# Patient Record
Sex: Male | Born: 1975 | Race: White | Hispanic: No | Marital: Single | Smoking: Former smoker
Health system: Southern US, Community
[De-identification: ages and names within clinical notes are randomized; demographics above are authoritative.]

## PROBLEM LIST (undated history)

## (undated) DIAGNOSIS — Z22322 Carrier or suspected carrier of Methicillin resistant Staphylococcus aureus: Secondary | ICD-10-CM

## (undated) DIAGNOSIS — J45909 Unspecified asthma, uncomplicated: Secondary | ICD-10-CM

## (undated) HISTORY — PX: CHOLECYSTECTOMY: SHX55

---

## 2009-05-06 ENCOUNTER — Emergency Department: Payer: Self-pay | Admitting: Unknown Physician Specialty

## 2009-08-23 ENCOUNTER — Emergency Department (HOSPITAL_COMMUNITY): Admission: EM | Admit: 2009-08-23 | Discharge: 2009-08-23 | Payer: Self-pay | Admitting: Emergency Medicine

## 2010-01-30 ENCOUNTER — Emergency Department (HOSPITAL_COMMUNITY)
Admission: EM | Admit: 2010-01-30 | Discharge: 2010-01-30 | Payer: Self-pay | Source: Home / Self Care | Admitting: Emergency Medicine

## 2010-03-02 ENCOUNTER — Emergency Department: Payer: Self-pay | Admitting: Internal Medicine

## 2011-02-13 ENCOUNTER — Emergency Department: Payer: Self-pay | Admitting: Emergency Medicine

## 2011-02-13 LAB — URINALYSIS, COMPLETE
Bilirubin,UR: NEGATIVE
Blood: NEGATIVE
Ketone: NEGATIVE
Ph: 6 (ref 4.5–8.0)
Protein: NEGATIVE
RBC,UR: NONE SEEN /HPF (ref 0–5)
Specific Gravity: 1.024 (ref 1.003–1.030)
Squamous Epithelial: NONE SEEN

## 2011-02-13 LAB — LIPASE, BLOOD: Lipase: 91 U/L (ref 73–393)

## 2011-02-13 LAB — CBC
HGB: 14.5 g/dL (ref 13.0–18.0)
MCH: 31.1 pg (ref 26.0–34.0)
MCV: 90 fL (ref 80–100)
RBC: 4.67 10*6/uL (ref 4.40–5.90)
RDW: 12.8 % (ref 11.5–14.5)

## 2011-02-13 LAB — COMPREHENSIVE METABOLIC PANEL
Anion Gap: 8 (ref 7–16)
Calcium, Total: 8.7 mg/dL (ref 8.5–10.1)
Chloride: 104 mmol/L (ref 98–107)
EGFR (African American): >60
EGFR (Non-African Amer.): >60
Potassium: 3.7 mmol/L (ref 3.5–5.1)
SGOT(AST): 24 U/L (ref 15–37)

## 2011-02-13 LAB — TROPONIN I: Troponin-I: <0.02 ng/mL

## 2011-12-14 ENCOUNTER — Emergency Department (HOSPITAL_COMMUNITY): Payer: Self-pay

## 2011-12-14 ENCOUNTER — Encounter (HOSPITAL_COMMUNITY): Payer: Self-pay | Admitting: Emergency Medicine

## 2011-12-14 ENCOUNTER — Emergency Department (HOSPITAL_COMMUNITY)
Admission: EM | Admit: 2011-12-14 | Discharge: 2011-12-14 | Disposition: A | Discharge status: Home / Self Care | Payer: Self-pay | Attending: Emergency Medicine | Admitting: Emergency Medicine

## 2011-12-14 DIAGNOSIS — R630 Anorexia: Secondary | ICD-10-CM | POA: Insufficient documentation

## 2011-12-14 DIAGNOSIS — R109 Unspecified abdominal pain: Secondary | ICD-10-CM | POA: Insufficient documentation

## 2011-12-14 DIAGNOSIS — R112 Nausea with vomiting, unspecified: Secondary | ICD-10-CM | POA: Insufficient documentation

## 2011-12-14 DIAGNOSIS — J45909 Unspecified asthma, uncomplicated: Secondary | ICD-10-CM | POA: Insufficient documentation

## 2011-12-14 HISTORY — DX: Unspecified asthma, uncomplicated: J45.909

## 2011-12-14 LAB — CBC WITH DIFFERENTIAL/PLATELET
HCT: 45.8 % (ref 39.0–52.0)
Hemoglobin: 16 g/dL (ref 13.0–17.0)
MCH: 30.7 pg (ref 26.0–34.0)
MCV: 87.9 fL (ref 78.0–100.0)
RBC: 5.21 MIL/uL (ref 4.22–5.81)
WBC: 6.9 10*3/uL (ref 4.0–10.5)

## 2011-12-14 LAB — URINALYSIS, ROUTINE W REFLEX MICROSCOPIC
Bilirubin Urine: NEGATIVE
Ketones, ur: NEGATIVE mg/dL
Leukocytes, UA: NEGATIVE
Protein, ur: NEGATIVE mg/dL
Urobilinogen, UA: 1 mg/dL (ref 0.0–1.0)
pH: 8 (ref 5.0–8.0)

## 2011-12-14 LAB — COMPREHENSIVE METABOLIC PANEL
ALT: 31 U/L (ref 0–53)
Chloride: 103 mEq/L (ref 96–112)
GFR calc Af Amer: >90 mL/min (ref >90)
Glucose, Bld: 115 mg/dL — ABNORMAL HIGH (ref 70–99)

## 2011-12-14 MED ORDER — ONDANSETRON HCL 4 MG/2ML IJ SOLN
4.0000 mg | Freq: Once | INTRAMUSCULAR | Status: AC
  Administered 2011-12-14: 4 mg via INTRAVENOUS
  Filled 2011-12-14: qty 2

## 2011-12-14 MED ORDER — SUCRALFATE 1 GM/10ML PO SUSP: 1.0000 g | Freq: Four times a day (QID) | ORAL | Status: DC | PRN

## 2011-12-14 MED ORDER — FAMOTIDINE 20 MG PO TABS: 20.0000 mg | ORAL_TABLET | Freq: Two times a day (BID) | ORAL | Status: DC

## 2011-12-14 MED ORDER — HYDROMORPHONE HCL PF 1 MG/ML IJ SOLN
1.0000 mg | INTRAMUSCULAR | Status: DC | PRN
  Administered 2011-12-14 (×2): 1 mg via INTRAVENOUS
  Filled 2011-12-14 (×2): qty 1

## 2011-12-14 MED ORDER — IOHEXOL 300 MG/ML  SOLN
80.0000 mL | Freq: Once | INTRAMUSCULAR | Status: AC | PRN
  Administered 2011-12-14: 80 mL via INTRAVENOUS

## 2011-12-14 MED ORDER — TRAMADOL HCL 50 MG PO TABS: 50.0000 mg | ORAL_TABLET | Freq: Four times a day (QID) | ORAL | Status: DC | PRN

## 2011-12-14 NOTE — ED Notes (Signed)
MD at bedside. 

## 2011-12-14 NOTE — ED Provider Notes (Signed)
History     CSN: [id number]  Arrival date & time 12/14/11  1003   First MD Initiated Contact with Patient 12/14/11 1319      No chief complaint on file.   (Consider location/radiation/quality/duration/timing/severity/associated sxs/prior treatment) HPI The patient presents with abdominal pain that has been present for one year, but worsened significantly over the past days.  The pain is focally in his upper, and left upper abdomen.  The pain is sharp, burning, with associated nausea, worsening anorexia.  The patient has been evaluated once for this pain.  He states that he had an ultrasound which was reported to be abnormal.  He does not know what abnormality was discovered.  He states for the past days the pain has become more severe, not wanting to oral medication.  He denies concurrent diarrhea, bowel habit changes, fever he does endorse chills, persistent nausea, occasional emesis. Past Medical History  Diagnosis Date  . Asthma     No past surgical history on file.  No family history on file.  History  Substance Use Topics  . Smoking status: Never Smoker   . Smokeless tobacco: Not on file  . Alcohol Use: Yes      Review of Systems  Constitutional:       Per HPI, otherwise negative  HENT:       Per HPI, otherwise negative  Eyes: Negative.   Respiratory:       Per HPI, otherwise negative  Cardiovascular:       Per HPI, otherwise negative  Gastrointestinal: Positive for nausea, vomiting and abdominal pain.  Genitourinary: Negative.   Musculoskeletal:       Per HPI, otherwise negative  Skin: Negative.   Neurological: Negative for syncope.    Allergies  Review of patient's allergies indicates no known allergies.  Home Medications   Current Outpatient Rx  Name  Route  Sig  Dispense  Refill  . FAMOTIDINE 20 MG PO TABS   Oral   Take 1 tablet (20 mg total) by mouth 2 (two) times daily.   30 tablet   0   . SUCRALFATE 1 GM/10ML PO SUSP   Oral   Take 10 mLs  (1 g total) by mouth every 6 (six) hours as needed (abdominal pain).   420 mL   0   . TRAMADOL HCL 50 MG PO TABS   Oral   Take 1 tablet (50 mg total) by mouth every 6 (six) hours as needed for pain.   15 tablet   0     BP 123/77  Pulse 86  Temp 98 F (36.7 C) (Oral)  Resp 18  SpO2 98%  Physical Exam  Nursing note and vitals reviewed. Constitutional: He is oriented to person, place, and time. He appears well-developed. No distress.  HENT:  Head: Normocephalic and atraumatic.  Eyes: Conjunctivae normal and EOM are normal.  Cardiovascular: Normal rate and regular rhythm.   Pulmonary/Chest: Effort normal. No stridor. No respiratory distress.  Abdominal: He exhibits no distension. There is tenderness in the epigastric area and left upper quadrant. There is guarding. There is no rigidity.  Musculoskeletal: He exhibits no edema.  Neurological: He is alert and oriented to person, place, and time.  Skin: Skin is warm and dry.  Psychiatric: He has a normal mood and affect.    ED Course  Procedures (including critical care time)  Labs Reviewed  COMPREHENSIVE METABOLIC PANEL - Abnormal; Notable for the following:    Glucose, Bld 115 (*)  All other components within normal limits  URINALYSIS, ROUTINE W REFLEX MICROSCOPIC - Abnormal; Notable for the following:    Color, Urine AMBER (*)  BIOCHEMICALS MAY BE AFFECTED BY COLOR   All other components within normal limits  CBC WITH DIFFERENTIAL  LIPASE, BLOOD   Ct Abdomen Pelvis W Contrast  12/14/2011  *RADIOLOGY REPORT*  Clinical Data: Left lower quadrant abdominal pain  CT ABDOMEN AND PELVIS WITH CONTRAST  Technique:  Multidetector CT imaging of the abdomen and pelvis was performed following the standard protocol during bolus administration of intravenous contrast.  Contrast: 80mL OMNIPAQUE IOHEXOL 300 MG/ML  SOLN  Comparison: None.  Findings: 4 mm nodule left lower lobe subpleural on image two. Normal heart size.  No pleural or  pericardial effusion.  Suboptimal contrast bolus.  Allowing for this, unremarkable liver, biliary system, spleen, pancreas, adrenal glands.  Symmetric renal enhancement with the exception of a subcentimeter hypodensity on the right, favored to reflect a cyst.  No hydronephrosis or hydroureter.  No bowel obstruction.  No CT evidence for colitis.  Normal appendix.  No free intraperitoneal air or fluid.  No lymphadenopathy.  Normal caliber aorta and branch vessels.  Circumaortic left renal vein.  Thin-walled bladder.  No acute osseous finding.  IMPRESSION: No acute abdominopelvic process identified by CT.  4 mm left lower lobe pulmonary nodule. If the patient is at high risk for bronchogenic carcinoma, follow-up chest CT at 1 year is recommended.  If the patient is at low risk, no follow-up is needed.  This recommendation follows the consensus statement: Guidelines for Management of Small Pulmonary Nodules Detected on CT Scans:  A Statement from the Fleischner Society as published in Radiology 2005; 237:395-400.   Original Report Authenticated By: Jearld Lesch, M.D.      1. Abdominal pain    5:19 PM The patient is substantially improved.  Reviewed all radiographic and lab studies with the patient.  He was made aware of the lung nodule finding.  Absent a smoking history, he will followup with his physician.  He was also advised to follow up with his gastroenterologist.   MDM  This young male presents with worsening abdominal pain.  On exam he is guarding about his epigastrium, left upper quadrant.  Given this, his distress, he had CT scan, which was largely reassuring for gastroenterologic findings, but demonstrated a pulmonary nodule.  With improvement in the patient's symptoms, no evidence of hepatobiliary dysfunction, he was discharged to follow up with her gastroenterologist in his primary care physician in stable        Gerhard Munch, MD 12/14/11 1720

## 2011-12-14 NOTE — ED Notes (Signed)
abd pain x 1 year states has seen a dr and had a [place] and " something that is not working " and needs to come ou he cannot remember what has taken otc med not helping  Denies dysuria but has to starin he staes

## 2011-12-21 ENCOUNTER — Other Ambulatory Visit: Payer: Self-pay | Admitting: Gastroenterology

## 2011-12-21 DIAGNOSIS — R109 Unspecified abdominal pain: Secondary | ICD-10-CM

## 2011-12-24 ENCOUNTER — Other Ambulatory Visit: Payer: Self-pay

## 2012-01-24 ENCOUNTER — Ambulatory Visit: Payer: Self-pay | Admitting: Emergency Medicine

## 2012-01-24 LAB — HEPATIC FUNCTION PANEL A (ARMC)
Albumin: 4.2 g/dL (ref 3.4–5.0)
Alkaline Phosphatase: 91 U/L (ref 50–136)
Bilirubin, Direct: <0.1 mg/dL (ref 0.00–0.20)

## 2012-01-29 ENCOUNTER — Ambulatory Visit: Payer: Self-pay | Admitting: Emergency Medicine

## 2012-01-31 LAB — PATHOLOGY REPORT

## 2012-08-20 ENCOUNTER — Emergency Department (HOSPITAL_COMMUNITY)
Admission: EM | Admit: 2012-08-20 | Discharge: 2012-08-20 | Disposition: A | Discharge status: Home / Self Care | Payer: Self-pay | Attending: Emergency Medicine | Admitting: Emergency Medicine

## 2012-08-20 ENCOUNTER — Encounter (HOSPITAL_COMMUNITY): Payer: Self-pay | Admitting: Emergency Medicine

## 2012-08-20 DIAGNOSIS — Z9089 Acquired absence of other organs: Secondary | ICD-10-CM | POA: Insufficient documentation

## 2012-08-20 DIAGNOSIS — J45909 Unspecified asthma, uncomplicated: Secondary | ICD-10-CM | POA: Insufficient documentation

## 2012-08-20 DIAGNOSIS — Z79899 Other long term (current) drug therapy: Secondary | ICD-10-CM | POA: Insufficient documentation

## 2012-08-20 DIAGNOSIS — R140 Abdominal distension (gaseous): Secondary | ICD-10-CM

## 2012-08-20 DIAGNOSIS — R5381 Other malaise: Secondary | ICD-10-CM | POA: Insufficient documentation

## 2012-08-20 DIAGNOSIS — R141 Gas pain: Secondary | ICD-10-CM | POA: Insufficient documentation

## 2012-08-20 DIAGNOSIS — R142 Eructation: Secondary | ICD-10-CM | POA: Insufficient documentation

## 2012-08-20 DIAGNOSIS — R630 Anorexia: Secondary | ICD-10-CM | POA: Insufficient documentation

## 2012-08-20 DIAGNOSIS — R197 Diarrhea, unspecified: Secondary | ICD-10-CM | POA: Insufficient documentation

## 2012-08-20 DIAGNOSIS — R112 Nausea with vomiting, unspecified: Secondary | ICD-10-CM | POA: Insufficient documentation

## 2012-08-20 MED ORDER — LOPERAMIDE HCL 2 MG PO CAPS: 2.0000 mg | ORAL_CAPSULE | Freq: Four times a day (QID) | ORAL | Status: DC | PRN

## 2012-08-20 MED ORDER — DICYCLOMINE HCL 20 MG PO TABS: 20.0000 mg | ORAL_TABLET | Freq: Three times a day (TID) | ORAL | Status: DC

## 2012-08-20 NOTE — ED Notes (Addendum)
Left upper abd  And feels it is distended has had this for a year and had his GB out most of the time he has diarrhea and occ has mucus in it vomited x 2 days ago has  Lost job because this abd issues

## 2012-08-20 NOTE — ED Provider Notes (Signed)
CSN: [id number]     Arrival date & time 08/20/12  0725 History     First MD Initiated Contact with Patient 08/20/12 [phone]     Chief Complaint  Patient presents with  . Abdominal Pain   (Consider location/radiation/quality/duration/timing/severity/associated sxs/prior Treatment) HPI Pt is a 37yo male with hx of chronic abdominal pain for over 59yr, hx of cholecystectomy in December 2013.  Pt states he was here for same back in December, given antiacid medication but has been having symptoms since. C/o abdominal bloating, "gas," LUQ pain that is aching, discomfort, 6/10 associated with nausea, occasional vomiting 1x/week, and 2-3 episodes of yellow colored diarrhea, with occasional "normal" stools w/o blood.  Today states nothing is new, he just wants to know what is wrong with him.  Was seen by gastroenterology after cholecystectomy but states that physician wanted to start from the beginning with acid reflux medication.  Pt states he has been on multiple prescription drug for same with minimal relief so now takes "cheap" OTC medications.  States he has decreased appetite due to nausea and discomfort but was never told to be on a specific diet after surgery.  Pt states he wonders if he has crohn's as he has done some research and states it is hard to detect.  Denies fever, weight change, urinary symptoms.  Denies other significant PMH.    Past Medical History  Diagnosis Date  . Asthma    Past Surgical History  Procedure Laterality Date  . Cholecystectomy     No family history on file. History  Substance Use Topics  . Smoking status: Never Smoker   . Smokeless tobacco: Not on file  . Alcohol Use: Yes    Review of Systems  Constitutional: Positive for fatigue. Negative for fever, chills and unexpected weight change.  Gastrointestinal: Positive for nausea, vomiting, abdominal pain, diarrhea and abdominal distention ( "bloating"). Negative for constipation and blood in stool.  All other  systems reviewed and are negative.    Allergies  Review of patient's allergies indicates no known allergies.  Home Medications   Current Outpatient Rx  Name  Route  Sig  Dispense  Refill  . ranitidine (ZANTAC) 150 MG tablet   Oral   Take 150 mg by mouth 2 (two) times daily as needed for heartburn.         . dicyclomine (BENTYL) 20 MG tablet   Oral   Take 1 tablet (20 mg total) by mouth 4 (four) times daily -  before meals and at bedtime.   20 tablet   0   . loperamide (IMODIUM) 2 MG capsule   Oral   Take 1 capsule (2 mg total) by mouth 4 (four) times daily as needed for diarrhea or loose stools.   12 capsule   0    BP 129/93  Pulse 78  Temp(Src) 97.6 F (36.4 C) (Oral)  Resp 17  SpO2 98% Physical Exam  Nursing note and vitals reviewed. Constitutional: He appears well-developed and well-nourished.  Pt sitting comfortably in exam bed, NAD.    HENT:  Head: Normocephalic and atraumatic.  Eyes: Conjunctivae are normal. No scleral icterus.  Neck: Normal range of motion.  Cardiovascular: Normal rate, regular rhythm and normal heart sounds.   Pulmonary/Chest: Effort normal and breath sounds normal. No respiratory distress. He has no wheezes. He has no rales. He exhibits no tenderness.  Abdominal: Soft. Bowel sounds are normal. He exhibits no distension and no mass. There is no tenderness. There is no  rebound and no guarding.  Soft, NDNT  Musculoskeletal: Normal range of motion.  Neurological: He is alert.  Skin: Skin is warm and dry.    ED Course   Procedures (including critical care time)  Labs Reviewed - No data to display No results found. 1. Abdominal bloating   2. Diarrhea     MDM  Not concerned for emergent process taking place at this time.  Reviewed previous medical records.  Do not believe further testing is needed at this time in the emergency room setting.  Discussed pt with Dr. Wilkie Aye who also evaluated the pt.  Symptoms likely due to IBS in that he  has vague, persistent symptoms.  Will Rx: dicyclomine, advised may cause drowsiness, avoid alcohol. and imodium for symptomatic relief.  Will discharge pt home and have her f/u with [facility] as well as [facility], info provided. Return precautions given. Pt verbalized understanding and agreement with tx plan. Vitals: unremarkable. Discharged in stable condition.    Discussed pt with attending during ED encounter.     Junius Finner, PA-C 08/20/12 [phone]

## 2012-08-20 NOTE — ED Provider Notes (Signed)
Medical screening examination/treatment/procedure(s) were conducted as a shared visit with non-physician practitioner(s) and myself.  I personally evaluated the patient during the encounter  Patient presents with chronic abdominal complaint.  Non-toxic and vs nl.  Exam benign.  No change in patients symptoms.  SUspect IBS.  Patient will be d/c with GI follow-up.  Patient started on imodium and bentyl.  Shon Baton, MD 08/20/12 2136

## 2013-05-06 ENCOUNTER — Emergency Department: Payer: Self-pay | Admitting: Emergency Medicine

## 2013-09-15 ENCOUNTER — Emergency Department: Payer: Self-pay | Admitting: Emergency Medicine

## 2013-10-17 ENCOUNTER — Emergency Department: Payer: Self-pay | Admitting: Emergency Medicine

## 2013-10-19 ENCOUNTER — Emergency Department: Payer: Self-pay | Admitting: Emergency Medicine

## 2013-11-17 ENCOUNTER — Ambulatory Visit: Payer: Self-pay | Admitting: Chiropractor

## 2014-07-24 ENCOUNTER — Encounter: Payer: Self-pay | Admitting: Emergency Medicine

## 2014-07-24 ENCOUNTER — Emergency Department: Payer: Self-pay

## 2014-07-24 ENCOUNTER — Inpatient Hospital Stay
Admission: EM | Admit: 2014-07-24 | Discharge: 2014-07-27 | DRG: 872 | Disposition: A | Discharge status: Home / Self Care | Payer: Self-pay | Attending: Internal Medicine | Admitting: Internal Medicine

## 2014-07-24 DIAGNOSIS — Z79899 Other long term (current) drug therapy: Secondary | ICD-10-CM

## 2014-07-24 DIAGNOSIS — E876 Hypokalemia: Secondary | ICD-10-CM | POA: Diagnosis present

## 2014-07-24 DIAGNOSIS — Z87891 Personal history of nicotine dependence: Secondary | ICD-10-CM

## 2014-07-24 DIAGNOSIS — A419 Sepsis, unspecified organism: Principal | ICD-10-CM | POA: Diagnosis present

## 2014-07-24 DIAGNOSIS — M7989 Other specified soft tissue disorders: Secondary | ICD-10-CM

## 2014-07-24 DIAGNOSIS — I82402 Acute embolism and thrombosis of unspecified deep veins of left lower extremity: Secondary | ICD-10-CM | POA: Diagnosis not present

## 2014-07-24 DIAGNOSIS — L03116 Cellulitis of left lower limb: Secondary | ICD-10-CM | POA: Diagnosis present

## 2014-07-24 DIAGNOSIS — J45909 Unspecified asthma, uncomplicated: Secondary | ICD-10-CM | POA: Diagnosis present

## 2014-07-24 DIAGNOSIS — R6510 Systemic inflammatory response syndrome (SIRS) of non-infectious origin without acute organ dysfunction: Secondary | ICD-10-CM

## 2014-07-24 DIAGNOSIS — Z9049 Acquired absence of other specified parts of digestive tract: Secondary | ICD-10-CM | POA: Diagnosis present

## 2014-07-24 DIAGNOSIS — L02416 Cutaneous abscess of left lower limb: Secondary | ICD-10-CM | POA: Diagnosis present

## 2014-07-24 LAB — COMPREHENSIVE METABOLIC PANEL
ALT: 17 U/L (ref 17–63)
AST: 24 U/L (ref 15–41)
Albumin: 4.5 g/dL (ref 3.5–5.0)
Alkaline Phosphatase: 87 U/L (ref 38–126)
Anion gap: 9 (ref 5–15)
BILIRUBIN TOTAL: 1.2 mg/dL (ref 0.3–1.2)
BUN: 9 mg/dL (ref 6–20)
CALCIUM: 8.9 mg/dL (ref 8.9–10.3)
CHLORIDE: 100 mmol/L — AB (ref 101–111)
CO2: 26 mmol/L (ref 22–32)
Creatinine, Ser: 1.12 mg/dL (ref 0.61–1.24)
GLUCOSE: 118 mg/dL — AB (ref 65–99)
Potassium: 3.3 mmol/L — ABNORMAL LOW (ref 3.5–5.1)
SODIUM: 135 mmol/L (ref 135–145)
Total Protein: 8.2 g/dL — ABNORMAL HIGH (ref 6.5–8.1)

## 2014-07-24 LAB — CBC
HEMATOCRIT: 43.7 % (ref 40.0–52.0)
Hemoglobin: 14.9 g/dL (ref 13.0–18.0)
MCH: 30 pg (ref 26.0–34.0)
MCHC: 34.2 g/dL (ref 32.0–36.0)
MCV: 87.9 fL (ref 80.0–100.0)
PLATELETS: 213 10*3/uL (ref 150–440)
RBC: 4.97 MIL/uL (ref 4.40–5.90)
RDW: 12.6 % (ref 11.5–14.5)
WBC: 15.2 10*3/uL — AB (ref 3.8–10.6)

## 2014-07-24 MED ORDER — ACETAMINOPHEN 500 MG PO TABS
ORAL_TABLET | ORAL | Status: AC
  Filled 2014-07-24: qty 2

## 2014-07-24 MED ORDER — ACETAMINOPHEN 500 MG PO TABS
1000.0000 mg | ORAL_TABLET | Freq: Once | ORAL | Status: AC
  Administered 2014-07-24: 1000 mg via ORAL

## 2014-07-24 MED ORDER — LIDOCAINE-EPINEPHRINE-TETRACAINE (LET) SOLUTION
3.0000 mL | Freq: Once | NASAL | Status: AC
  Administered 2014-07-24: 3 mL via TOPICAL

## 2014-07-24 MED ORDER — SODIUM CHLORIDE 0.9 % IV SOLN
1000.0000 mL | Freq: Once | INTRAVENOUS | Status: AC
  Administered 2014-07-24: 1000 mL via INTRAVENOUS

## 2014-07-24 MED ORDER — VANCOMYCIN HCL 10 G IV SOLR
1500.0000 mg | Freq: Two times a day (BID) | INTRAVENOUS | Status: DC
  Administered 2014-07-25 (×2): 1500 mg via INTRAVENOUS
  Filled 2014-07-24 (×5): qty 1500

## 2014-07-24 MED ORDER — VANCOMYCIN HCL 10 G IV SOLR
1500.0000 mg | Freq: Once | INTRAVENOUS | Status: AC
  Administered 2014-07-24: 1500 mg via INTRAVENOUS
  Filled 2014-07-24: qty 1500

## 2014-07-24 MED ORDER — LIDOCAINE-EPINEPHRINE-TETRACAINE (LET) SOLUTION
NASAL | Status: AC
  Filled 2014-07-24: qty 3

## 2014-07-24 NOTE — ED Notes (Signed)
Pharmacy called for vancomycin.  

## 2014-07-24 NOTE — ED Notes (Signed)
Pt requesting po fluids and food. Pt provided with meal tray and po fluids with dr. Kyra Manges consent.

## 2014-07-24 NOTE — ED Provider Notes (Signed)
[facility] Emergency Department Provider Note  ____________________________________________  Time seen: On arrival  I have reviewed the triage vital signs and the nursing notes.   HISTORY  Chief Complaint Leg Pain    HPI Brian Lutz is a 39 y.o. male who presents with swelling and pain to his left lower leg. He reports a boil developed on his left anterior shin several days ago, his uncle tried to drain it. The next day his leg was swollen and painful and red. He has never had this before. He denies fever but does report a rapid heart rate. He denies a history of diabetes     Past Medical History  Diagnosis Date  . Asthma     There are no active problems to display for this patient.   Past Surgical History  Procedure Laterality Date  . Cholecystectomy      Current Outpatient Rx  Name  Route  Sig  Dispense  Refill  . dicyclomine (BENTYL) 20 MG tablet   Oral   Take 1 tablet (20 mg total) by mouth 4 (four) times daily -  before meals and at bedtime.   20 tablet   0   . loperamide (IMODIUM) 2 MG capsule   Oral   Take 1 capsule (2 mg total) by mouth 4 (four) times daily as needed for diarrhea or loose stools.   12 capsule   0     Allergies Review of patient's allergies indicates no known allergies.  History reviewed. No pertinent family history.  Social History History  Substance Use Topics  . Smoking status: Former Research scientist (life sciences)  . Smokeless tobacco: Never Used  . Alcohol Use: No    Review of Systems  Constitutional: Negative for fever. Eyes: Negative for visual changes. ENT: Negative for sore throat Cardiovascular: Negative for chest pain. Respiratory: Negative for shortness of breath. Gastrointestinal: Negative for abdominal pain, vomiting and diarrhea. Genitourinary: Negative for dysuria. Musculoskeletal: Positive for leg swelling Skin: As per erythema to the left lower leg Neurological: Negative for headaches or focal  weakness Psychiatric: No anxiety  10-point ROS otherwise negative.  ____________________________________________   PHYSICAL EXAM:  VITAL SIGNS: ED Triage Vitals  Enc Vitals Group     BP 07/24/14 1945 117/79 mmHg     Pulse Rate 07/24/14 1945 111     Resp 07/24/14 1945 20     Temp 07/24/14 1945 99 F (37.2 C)     Temp Source 07/24/14 1945 Oral     SpO2 07/24/14 1945 98 %     Weight 07/24/14 1945 193 lb (87.544 kg)     Height 07/24/14 1945 6\' 1"  (1.854 m)     Head Cir --      Peak Flow --      Pain Score 07/24/14 1946 10     Pain Loc --      Pain Edu? --      Excl. in [place]? --      Constitutional: Alert and oriented. Well appearing and in no distress. Eyes: Conjunctivae are normal.  ENT   Head: Normocephalic and atraumatic.   Mouth/Throat: Mucous membranes are moist. Cardiovascular: Tachycardia regular rhythm. Normal and symmetric distal pulses are present in all extremities. No murmurs, rubs, or gallops. Respiratory: Normal respiratory effort without tachypnea nor retractions. Breath sounds are clear and equal bilaterally.  Gastrointestinal: Soft and non-tender in all quadrants. No distention. There is no CVA tenderness. Genitourinary: deferred Musculoskeletal: Left lower leg with swelling and induration to the  mid anterior tibia. There is a small scab in the center with erythema extending from there. Streaking noted to the right knee. Swelling diffusely to the left lower leg Neurologic:  Normal speech and language. No gross focal neurologic deficits are appreciated. Skin:  Skin is warm, dry and intact. As above Psychiatric: Mood and affect are normal. Patient exhibits appropriate insight and judgment.  ____________________________________________    LABS (pertinent positives/negatives)  Labs Reviewed  CBC  COMPREHENSIVE METABOLIC PANEL    ____________________________________________   EKG  None  ____________________________________________     RADIOLOGY I have personally reviewed any xrays that were ordered on this patient: Tibia x-ray shows no gas forming organisms ____________________________________________   PROCEDURES  Procedure(s) performed: none  Critical Care performed: none  ____________________________________________   INITIAL IMPRESSION / ASSESSMENT AND PLAN / ED COURSE  Pertinent labs & imaging results that were available during my care of the patient were reviewed by me and considered in my medical decision making (see chart for details).  Patient's exam is consistent with cellulitis. He is noticeably tachycardic and his temperature is slightly elevated. We will check labs place an IV and consider admission  ----------------------------------------- 10:43 PM on 07/24/2014 -----------------------------------------  Patient is febrile and his heart rate is increased. He is receiving vancomycin  ____________________________________________   FINAL CLINICAL IMPRESSION(S) / ED DIAGNOSES  Cellulitis of left lower extremity Systemic inflammatory response syndrome   Lavonia Drafts, MD 07/24/14 2244

## 2014-07-24 NOTE — ED Notes (Signed)
Pt says he noticed his left lower leg was feeling sore on Thursday; noticed some swelling to his shin; says Friday his uncle tried to open the area and get some drainage from site; now entire lower leg and foot are swollen; redness and warmth to area; line of redness noted to be running up from area;

## 2014-07-24 NOTE — Progress Notes (Signed)
ANTIBIOTIC CONSULT NOTE - INITIAL  Pharmacy Consult for Vancomycin Indication: Cellulitis  No Known Allergies  Patient Measurements: Height: 6\' 1"  (185.4 cm) Weight: 193 lb (87.544 kg) IBW/kg (Calculated) : 79.9 Adjusted Body Weight: n/a  Vital Signs: Temp: 103.1 F (39.5 C) (07/23 2140) Temp Source: Oral (07/23 2140) BP: 105/72 mmHg (07/23 2140) Pulse Rate: 126 (07/23 2140) Intake/Output from previous day:   Intake/Output from this shift:    Labs:  Recent Labs  07/24/14 2011  WBC 15.2*  HGB 14.9  PLT 213  CREATININE 1.12   Estimated Creatinine Clearance: 102.1 mL/min (by C-G formula based on Cr of 1.12). No results for input(s): VANCOTROUGH, VANCOPEAK, VANCORANDOM, GENTTROUGH, GENTPEAK, GENTRANDOM, TOBRATROUGH, TOBRAPEAK, TOBRARND, AMIKACINPEAK, AMIKACINTROU, AMIKACIN in the last 72 hours.   Microbiology: No results found for this or any previous visit (from the past 720 hour(s)).  Medical History: Past Medical History  Diagnosis Date  . Asthma     Medications:  Anti-infectives    Start     Dose/Rate Route Frequency Ordered Stop   07/25/14 0900  vancomycin (VANCOCIN) 1,500 mg in sodium chloride 0.9 % 500 mL IVPB     1,500 mg 250 mL/hr over 120 Minutes Intravenous Every 12 hours 07/24/14 2204     07/24/14 2130  vancomycin (VANCOCIN) 1,500 mg in sodium chloride 0.9 % 500 mL IVPB     1,500 mg 250 mL/hr over 120 Minutes Intravenous  Once 07/24/14 2128       Assessment: Patient to be initiated on Vancomycin for cellulitis.  Goal of Therapy:  Vancomycin trough level 10-15 mcg/ml  Plan:  Measure antibiotic drug levels at steady state Follow up culture results Will order Vancomycin 1500mg  IV q12h. Will check trough level prior to 5th dose.  Paulina Fusi, PharmD, BCPS 07/24/2014 10:07 PM

## 2014-07-24 NOTE — ED Notes (Addendum)
Dr. Corky Downs notified of pt's temperature and hr. Pt is currently experiencing chills after awaking from sleep to have pain assessed and provide meal tray. Order for tylenol received.

## 2014-07-24 NOTE — ED Notes (Signed)
hospitalist in to see pt.

## 2014-07-25 ENCOUNTER — Encounter: Payer: Self-pay | Admitting: *Deleted

## 2014-07-25 LAB — CBC
HEMATOCRIT: 38.9 % — AB (ref 40.0–52.0)
Hemoglobin: 13.3 g/dL (ref 13.0–18.0)
MCH: 30 pg (ref 26.0–34.0)
MCHC: 34.2 g/dL (ref 32.0–36.0)
MCV: 87.6 fL (ref 80.0–100.0)
Platelets: 168 10*3/uL (ref 150–440)
RBC: 4.44 MIL/uL (ref 4.40–5.90)
RDW: 12.5 % (ref 11.5–14.5)
WBC: 12.7 10*3/uL — ABNORMAL HIGH (ref 3.8–10.6)

## 2014-07-25 LAB — BASIC METABOLIC PANEL
ANION GAP: 5 (ref 5–15)
BUN: 10 mg/dL (ref 6–20)
CO2: 25 mmol/L (ref 22–32)
Calcium: 8.2 mg/dL — ABNORMAL LOW (ref 8.9–10.3)
Chloride: 106 mmol/L (ref 101–111)
Creatinine, Ser: 1 mg/dL (ref 0.61–1.24)
GFR calc Af Amer: >60 mL/min (ref >60)
GFR calc non Af Amer: >60 mL/min (ref >60)
GLUCOSE: 106 mg/dL — AB (ref 65–99)
POTASSIUM: 4 mmol/L (ref 3.5–5.1)
SODIUM: 136 mmol/L (ref 135–145)

## 2014-07-25 MED ORDER — OXYCODONE HCL 5 MG PO TABS
5.0000 mg | ORAL_TABLET | ORAL | Status: DC | PRN
  Administered 2014-07-25 – 2014-07-26 (×7): 5 mg via ORAL
  Filled 2014-07-25 (×7): qty 1

## 2014-07-25 MED ORDER — ACETAMINOPHEN 325 MG PO TABS: 650.0000 mg | ORAL_TABLET | Freq: Four times a day (QID) | ORAL | Status: DC | PRN

## 2014-07-25 MED ORDER — ACETAMINOPHEN 650 MG RE SUPP: 650.0000 mg | Freq: Four times a day (QID) | RECTAL | Status: DC | PRN

## 2014-07-25 MED ORDER — MORPHINE SULFATE 2 MG/ML IJ SOLN
2.0000 mg | INTRAMUSCULAR | Status: DC | PRN
  Administered 2014-07-25 – 2014-07-26 (×2): 2 mg via INTRAVENOUS
  Filled 2014-07-25 (×2): qty 1

## 2014-07-25 MED ORDER — ONDANSETRON HCL 4 MG/2ML IJ SOLN
4.0000 mg | Freq: Four times a day (QID) | INTRAMUSCULAR | Status: DC | PRN
  Administered 2014-07-26 (×2): 4 mg via INTRAVENOUS
  Filled 2014-07-25 (×2): qty 2

## 2014-07-25 MED ORDER — ALUM & MAG HYDROXIDE-SIMETH 200-200-20 MG/5ML PO SUSP: 30.0000 mL | Freq: Four times a day (QID) | ORAL | Status: DC | PRN

## 2014-07-25 MED ORDER — ONDANSETRON HCL 4 MG PO TABS: 4.0000 mg | ORAL_TABLET | Freq: Four times a day (QID) | ORAL | Status: DC | PRN

## 2014-07-25 MED ORDER — LOPERAMIDE HCL 2 MG PO CAPS: 2.0000 mg | ORAL_CAPSULE | Freq: Four times a day (QID) | ORAL | Status: DC | PRN

## 2014-07-25 MED ORDER — DOCUSATE SODIUM 100 MG PO CAPS
100.0000 mg | ORAL_CAPSULE | Freq: Two times a day (BID) | ORAL | Status: DC
  Administered 2014-07-25 – 2014-07-27 (×5): 100 mg via ORAL
  Filled 2014-07-25 (×5): qty 1

## 2014-07-25 MED ORDER — DICYCLOMINE HCL 20 MG PO TABS
20.0000 mg | ORAL_TABLET | Freq: Three times a day (TID) | ORAL | Status: DC
  Filled 2014-07-25 (×5): qty 1

## 2014-07-25 MED ORDER — POTASSIUM CHLORIDE 20 MEQ PO PACK
40.0000 meq | PACK | Freq: Once | ORAL | Status: AC
  Administered 2014-07-25: 40 meq via ORAL
  Filled 2014-07-25: qty 2

## 2014-07-25 NOTE — Progress Notes (Signed)
Physical Therapy Evaluation Patient Details Name: Brian Lutz MRN: [id number] DOB: [birth year] Today's Date: 07/25/2014   History of Present Illness  Patient is a 39 y.o. male with L LE cellulitis. Patient noticed a boil on L anterior tibia on 21 July and had uncle try to drain it. Admits that blood discharged. Came to ER on 23 July when leg became swollen, making weight bearing difficult.  Clinical Impression  Patient demonstrates increased uneasiness as PT discussed testing strength/mobility. Patient complained that every time someone touches his leg, he experiences increased pain. Believes that he cannot walk. Patient lives in mobile home with father and 46 y.o. Son. Has steps to enter. States he may be able to stay at aunt's if necessary upon d/c. Patient very limited in gait by increased pain with L LE in dependent position. Alternated between NWB and toe touch WB, ignoring verbal cues for safety during ambulation. Because patient is not at his previously independent baseline level of function, he will benefit from further PT services to increase ankle ROM, WB tolerance, balance, and gait distances.     Follow Up Recommendations Home health PT    Equipment Recommendations  Rolling walker with 5" wheels    Recommendations for Other Services       Precautions / Restrictions Precautions Precautions: Fall Restrictions Weight Bearing Restrictions: No      Mobility  Bed Mobility Overal bed mobility: Independent                Transfers Overall transfer level: Modified independent Equipment used: Rolling walker (2 wheeled)             General transfer comment: Patient moved from sit to stand and stand to sit by using R LE. Stated that pain in L LE increased in dependent position. Would not bear weight.  Ambulation/Gait Ambulation/Gait assistance: Min assist Ambulation Distance (Feet): 10 Feet Assistive device: Rolling walker (2 wheeled)       General Gait  Details: Patient demonstrated slight impulsive movements during gait, ignoring verbal cues for safety. Alternated between NWB on L LE and toe touch WB, complaining that pain is increasing.  Stairs            Wheelchair Mobility    Modified Rankin (Stroke Patients Only)       Balance Overall balance assessment: Modified Independent                                           Pertinent Vitals/Pain Pain Assessment: 0-10 Pain Score: 10-Worst pain ever Pain Location: L LE Pain Descriptors / Indicators: Throbbing;Pressure;Tingling;Other (Comment) (Tingling in toes) Pain Intervention(s): Limited activity within patient's tolerance;Monitored during session;Patient requesting pain meds-RN notified    Home Living Family/patient expects to be discharged to:: Private residence Living Arrangements: Children;Parent (Dad and 77 y.o. son) Available Help at Discharge: Family (Reports he may go to his aunt's house if necessary) Type of Home: Mobile home Home Access: Stairs to enter Entrance Stairs-Rails: Can reach both Entrance Stairs-Number of Steps: 6 Home Layout: One level Home Equipment: None      Prior Function Level of Independence: Independent               Hand Dominance        Extremity/Trunk Assessment   Upper Extremity Assessment: Overall WFL for tasks assessed           Lower  Extremity Assessment: LLE deficits/detail   LLE Deficits / Details: Patient complains of "numbness", decreased sensation in L LE. At least 3+/5 strength but wouldn't allow formal testing because pressure "is sending pain through the roof." Decreased dorsiflexion/plantarflexion due to swelling.     Communication   Communication: No difficulties  Cognition Arousal/Alertness: Awake/alert Behavior During Therapy: WFL for tasks assessed/performed;Impulsive Overall Cognitive Status: Within Functional Limits for tasks assessed                      General  Comments      Exercises        Assessment/Plan    PT Assessment Patient needs continued PT services  PT Diagnosis Difficulty walking;Acute pain   PT Problem List Decreased balance;Decreased mobility;Decreased knowledge of use of DME;Decreased safety awareness;Pain;Decreased activity tolerance;Decreased range of motion  PT Treatment Interventions Gait training;Functional mobility training;Therapeutic activities;Balance training;Patient/family education;DME instruction;Stair training   PT Goals (Current goals can be found in the Care Plan section) Acute Rehab PT Goals Patient Stated Goal: Decrease pain PT Goal Formulation: With patient Time For Goal Achievement: 08/08/14 Potential to Achieve Goals: Good    Frequency Min 2X/week   Barriers to discharge Inaccessible home environment Patient reports that he may be able to stay with aunt at d/c    Co-evaluation               End of Session Equipment Utilized During Treatment: Gait belt Activity Tolerance: Patient limited by pain Patient left: in bed;with call bell/phone within reach Nurse Communication: Mobility status;Patient requests pain meds         Time: 1320-1340 PT Time Calculation (min) (ACUTE ONLY): 20 min   Charges:   PT Evaluation $Initial PT Evaluation Tier I: 1 Procedure     PT G Codes:        Dorice Lamas, PT, DPT 07/25/2014, 1:57 PM

## 2014-07-25 NOTE — H&P (Signed)
[name] at [place] NAME: Brian Lutz    MR#:  [id number]  DATE OF BIRTH:  Jun 08, 1975  DATE OF ADMISSION:  07/24/2014  PRIMARY CARE PHYSICIAN: No PCP Per Patient   REQUESTING/REFERRING PHYSICIAN: dr.Kinner  CHIEF COMPLAINT:  Left leg swelling and redness  HISTORY OF PRESENT ILLNESS:  Brian Lutz  is a 39 y.o. male with a known history of asthma came into the ED with a chief complaint of swelling, redness and pain of the left lower leg. He reports a boil developed on his left anterior shin several days ago, his uncle tried to drain it. The next day his leg was swollen and painful and red. Patient was febrile with a temp of 103 in the ED. He was given vancomycin and hospitalist team is called to admit the patient  PAST MEDICAL HISTORY:   Past Medical History  Diagnosis Date  . Asthma     PAST SURGICAL HISTOIRY:   Past Surgical History  Procedure Laterality Date  . Cholecystectomy      SOCIAL HISTORY:   History  Substance Use Topics  . Smoking status: Former Research scientist (life sciences)  . Smokeless tobacco: Never Used  . Alcohol Use: No    FAMILY HISTORY:  History reviewed. No pertinent family history.  DRUG ALLERGIES:  No Known Allergies  REVIEW OF SYSTEMS:  CONSTITUTIONAL: No fever, fatigue or weakness.  EYES: No blurred or double vision.  EARS, NOSE, AND THROAT: No tinnitus or ear pain.  RESPIRATORY: No cough, shortness of breath, wheezing or hemoptysis.  CARDIOVASCULAR: No chest pain, orthopnea, edema.  GASTROINTESTINAL: No nausea, vomiting, diarrhea or abdominal pain.  GENITOURINARY: No dysuria, hematuria.  ENDOCRINE: No polyuria, nocturia,  HEMATOLOGY: No anemia, easy bruising or bleeding SKIN: No rash or lesion. MUSCULOSKELETAL: No joint pain or arthritis.  Left lower leg with redness, swelling and pain  NEUROLOGIC: No tingling, numbness, weakness.  PSYCHIATRY: No anxiety or depression.   MEDICATIONS AT HOME:   Prior  to Admission medications   Medication Sig Start Date End Date Taking? Authorizing Provider  dicyclomine (BENTYL) 20 MG tablet Take 1 tablet (20 mg total) by mouth 4 (four) times daily -  before meals and at bedtime. 08/20/12   Noland Fordyce, PA-C  loperamide (IMODIUM) 2 MG capsule Take 1 capsule (2 mg total) by mouth 4 (four) times daily as needed for diarrhea or loose stools. 08/20/12   Noland Fordyce, PA-C      VITAL SIGNS:  Blood pressure 103/60, pulse 104, temperature 99.6 F (37.6 C), temperature source Oral, resp. rate 18, height 6\' 1"  (1.854 m), weight 81.511 kg (179 lb 11.2 oz), SpO2 99 %.  PHYSICAL EXAMINATION:  GENERAL:  39 y.o.-year-old patient lying in the bed with no acute distress.  EYES: Pupils equal, round, reactive to light and accommodation. No scleral icterus. Extraocular muscles intact.  HEENT: Head atraumatic, normocephalic. Oropharynx and nasopharynx clear.  NECK:  Supple, no jugular venous distention. No thyroid enlargement, no tenderness.  LUNGS: Normal breath sounds bilaterally, no wheezing, rales,rhonchi or crepitation. No use of accessory muscles of respiration.  CARDIOVASCULAR: S1, S2 normal. No murmurs, rubs, or gallops.  ABDOMEN: Soft, nontender, nondistended. Bowel sounds present. No organomegaly or mass.  EXTREMITIES: No pedal edema, cyanosis, or clubbing. Left lower extremity erythema, edema and tenderness with a small boil with scab  NEUROLOGIC: Cranial nerves II through XII are intact. Muscle strength 5/5 in all extremities. Sensation intact. Gait not checked.  PSYCHIATRIC: The patient is  alert and oriented x 3.  SKIN: No obvious rash, lesion, or ulcer.   LABORATORY PANEL:   CBC  Recent Labs Lab 07/24/14 2011  WBC 15.2*  HGB 14.9  HCT 43.7  PLT 213   ------------------------------------------------------------------------------------------------------------------  Chemistries   Recent Labs Lab 07/24/14 2011  NA 135  K 3.3*  CL 100*  CO2  26  GLUCOSE 118*  BUN 9  CREATININE 1.12  CALCIUM 8.9  AST 24  ALT 17  ALKPHOS 87  BILITOT 1.2   ------------------------------------------------------------------------------------------------------------------  Cardiac Enzymes No results for input(s): TROPONINI in the last 168 hours. ------------------------------------------------------------------------------------------------------------------  RADIOLOGY:  Dg Tibia/fibula Left  07/24/2014   CLINICAL DATA:  Erythema and swelling involving the anterior left lower leg which began 2 days ago and has progressively worsened.  EXAM: LEFT TIBIA AND FIBULA - 2 VIEW  COMPARISON:  None.  FINDINGS: Edema in the subcutaneous tissues of the anterior lower leg. No underlying osseous abnormality. Small bone island in the distal tibial metaphysis. No significant intrinsic osseous abnormality. Visualized knee joint and ankle joint intact.  IMPRESSION: No significant osseous abnormality.   Electronically Signed   By: Evangeline Dakin M.D.   On: 07/24/2014 20:39    EKG:   Orders placed or performed in visit on 02/13/11  . EKG 12-Lead    IMPRESSION AND PLAN:   Brian Lutz  is a 39 y.o. male with a known history of asthma came into the ED with a chief complaint of swelling, redness and pain of the left lower leg. He reports a boil developed on his left anterior shin several days ago, his uncle tried to drain it. The next day his leg was swollen and painful and red. Patient was febrile with a temp of 103 in the ED. He was given vancomycin and hospitalist team is called to admit the patient   1. Sepsis 2/2Left lower extending to cellulitis-meets septic criteria as patient is febrile and has leukocytosis  Vancomycin IV Wound care consult Wound cultures Pain management as needed  2. History of asthma No exacerbation Provide nebulizers as needed basis for shortness of breath  3. Left lower extremity pain from problem #1 Provide pain management  when necessary  4. Hypokalemia Provide potassium supplements BMP in a.m.    All the records are reviewed and case discussed with ED provider. Management plans discussed with the patient, family and they are in agreement.  CODE STATUS: Full code  TOTAL TIME TAKING CARE OF THIS PATIENT: Reviewing medical records, history and physical, admission orders and coronation of care is 45 minutes.    Nicholes Mango M.D on 07/25/2014 at 1:42 AM  Between 7am to 6pm - Pager - [phone]  After 6pm go to [email or web address] - password [email or web address] [email or web address]  [facility]  Office  [phone]  CC: Primary care physician; No PCP Per Patient

## 2014-07-25 NOTE — Progress Notes (Signed)
Patient complains of pain this shift.  Wound appears to be staying within lined  Area.  Redness and heat from wound area.  Attempted to work with physical therapy but patient was not able to place very much weight on left leg, complained of intense imbearable pain.

## 2014-07-25 NOTE — ED Notes (Signed)
Less redness noted to left lower extremity. Pt sleeping, arouses easily to sleep to verbalize understanding of admission process.

## 2014-07-25 NOTE — Progress Notes (Signed)
[facility] at [place] NAME: Millan Legan    MR#:  [id number]  DATE OF BIRTH:  06/29/1975  SUBJECTIVE:  Left leg redness ad pai. Had fever 101 last nite  REVIEW OF SYSTEMS:   Review of Systems  Constitutional: Positive for fever. Negative for chills and weight loss.  HENT: Negative for ear discharge, ear pain and nosebleeds.   Eyes: Negative for blurred vision, pain and discharge.  Respiratory: Negative for sputum production, shortness of breath, wheezing and stridor.   Cardiovascular: Negative for chest pain, palpitations, orthopnea and PND.  Gastrointestinal: Negative for nausea, vomiting, abdominal pain and diarrhea.  Genitourinary: Negative for urgency and frequency.  Musculoskeletal: Positive for joint pain. Negative for back pain.  Skin: Positive for rash.  Neurological: Negative for sensory change, speech change, focal weakness and weakness.  Psychiatric/Behavioral: Negative for depression and hallucinations. The patient is not nervous/anxious.   All other systems reviewed and are negative.  Tolerating Diet:yes  DRUG ALLERGIES:  No Known Allergies  VITALS:  Blood pressure 109/69, pulse 83, temperature 98.6 F (37 C), temperature source Oral, resp. rate 18, height 6\' 1"  (1.854 m), weight 81.511 kg (179 lb 11.2 oz), SpO2 99 %.  PHYSICAL EXAMINATION:   Physical Exam  GENERAL:  39 y.o.-year-old patient lying in the bed with no acute distress.  EYES: Pupils equal, round, reactive to light and accommodation. No scleral icterus. Extraocular muscles intact.  HEENT: Head atraumatic, normocephalic. Oropharynx and nasopharynx clear.  NECK:  Supple, no jugular venous distention. No thyroid enlargement, no tenderness.  LUNGS: Normal breath sounds bilaterally, no wheezing, rales, rhonchi. No use of accessory muscles of respiration.  CARDIOVASCULAR: S1, S2 normal. No murmurs, rubs, or gallops.  ABDOMEN: Soft, nontender, nondistended.  Bowel sounds present. No organomegaly or mass.  EXTREMITIES: left tibial shin furuncle with cellulitis, edema + left leg NEUROLOGIC: Cranial nerves II through XII are intact. No focal Motor or sensory deficits b/l.   PSYCHIATRIC: The patient is alert and oriented x 3.  SKIN:as above   LABORATORY PANEL:   CBC  Recent Labs Lab 07/25/14 0348  WBC 12.7*  HGB 13.3  HCT 38.9*  PLT 168    Chemistries   Recent Labs Lab 07/24/14 2011 07/25/14 0348  NA 135 136  K 3.3* 4.0  CL 100* 106  CO2 26 25  GLUCOSE 118* 106*  BUN 9 10  CREATININE 1.12 1.00  CALCIUM 8.9 8.2*  AST 24  --   ALT 17  --   ALKPHOS 87  --   BILITOT 1.2  --     Cardiac Enzymes No results for input(s): TROPONINI in the last 168 hours.  RADIOLOGY:  Dg Tibia/fibula Left  07/24/2014   CLINICAL DATA:  Erythema and swelling involving the anterior left lower leg which began 2 days ago and has progressively worsened.  EXAM: LEFT TIBIA AND FIBULA - 2 VIEW  COMPARISON:  None.  FINDINGS: Edema in the subcutaneous tissues of the anterior lower leg. No underlying osseous abnormality. Small bone island in the distal tibial metaphysis. No significant intrinsic osseous abnormality. Visualized knee joint and ankle joint intact.  IMPRESSION: No significant osseous abnormality.   Electronically Signed   By: Evangeline Dakin M.D.   On: 07/24/2014 20:39     ASSESSMENT AND PLAN:   Recardo Linn is a 39 y.o. male with a known history of asthma came into the ED with a chief complaint of swelling, redness and pain of the  left lower leg. He reports a boil developed on his left anterior shin several days ago, his uncle tried to drain it. Patient was febrile with a temp of 103 in the ED  1. Sepsis 2/2Left lower extending to cellulitis-meets septic criteria as patient is febrile and has leukocytosis -left leg elevation -Vancomycin IV -Wound cultures pending -prn pain meds  2. History of asthma No exacerbation Provide nebulizers  as needed basis for shortness of breath  Management plans discussed with the patient and in agreement.  CODE STATUS: full  DVT Prophylaxis: heparin  TOTAL TIME TAKING CARE OF THIS PATIENT: 35 minutes.  >50% time spent on counselling and coordination of care with pt  POSSIBLE D/C IN 1-2DAYS, DEPENDING ON CLINICAL CONDITION.   Hiroyuki Ozanich M.D on 07/25/2014 at 10:25 AM  Between 7am to 6pm - Pager - [phone]  After 6pm go to [email or web address] - password [email or web address] [facility]  [facility]  Office  [phone]  CC: Primary care physician; No PCP Per Patient

## 2014-07-26 ENCOUNTER — Inpatient Hospital Stay: Payer: Self-pay | Admitting: Anesthesiology

## 2014-07-26 ENCOUNTER — Encounter
Admission: EM | Disposition: A | Discharge status: Home / Self Care | Payer: Self-pay | Source: Home / Self Care | Attending: Internal Medicine

## 2014-07-26 DIAGNOSIS — L02416 Cutaneous abscess of left lower limb: Secondary | ICD-10-CM

## 2014-07-26 HISTORY — PX: INCISION AND DRAINAGE ABSCESS: SHX5864

## 2014-07-26 LAB — SURGICAL PCR SCREEN
MRSA, PCR: POSITIVE — AB
STAPHYLOCOCCUS AUREUS: POSITIVE — AB

## 2014-07-26 SURGERY — INCISION AND DRAINAGE, ABSCESS
Anesthesia: General | Laterality: Left | Wound class: Dirty or Infected

## 2014-07-26 MED ORDER — LIDOCAINE HCL (CARDIAC) 20 MG/ML IV SOLN
INTRAVENOUS | Status: DC | PRN
  Administered 2014-07-26: 100 mg via INTRAVENOUS

## 2014-07-26 MED ORDER — LACTATED RINGERS IV SOLN
INTRAVENOUS | Status: DC | PRN
  Administered 2014-07-26: 16:00:00 via INTRAVENOUS

## 2014-07-26 MED ORDER — GLYCOPYRROLATE 0.2 MG/ML IJ SOLN
INTRAMUSCULAR | Status: DC | PRN
  Administered 2014-07-26: 0.3 mg via INTRAVENOUS

## 2014-07-26 MED ORDER — LIDOCAINE HCL (PF) 1 % IJ SOLN
30.0000 mL | Freq: Once | INTRAMUSCULAR | Status: DC
Start: 2014-07-26 — End: 2014-07-27
  Filled 2014-07-26: qty 30

## 2014-07-26 MED ORDER — VANCOMYCIN HCL 10 G IV SOLR
1500.0000 mg | Freq: Two times a day (BID) | INTRAVENOUS | Status: DC
  Administered 2014-07-26 – 2014-07-27 (×2): 1500 mg via INTRAVENOUS
  Filled 2014-07-26 (×5): qty 1500

## 2014-07-26 MED ORDER — SULFAMETHOXAZOLE-TRIMETHOPRIM 800-160 MG PO TABS: 1.0000 | ORAL_TABLET | Freq: Two times a day (BID) | ORAL | Status: DC

## 2014-07-26 MED ORDER — FENTANYL CITRATE (PF) 100 MCG/2ML IJ SOLN
25.0000 ug | INTRAMUSCULAR | Status: DC | PRN
  Administered 2014-07-26 (×4): 25 ug via INTRAVENOUS

## 2014-07-26 MED ORDER — ONDANSETRON HCL 4 MG/2ML IJ SOLN: 4.0000 mg | Freq: Once | INTRAMUSCULAR | Status: AC | PRN

## 2014-07-26 MED ORDER — PROPOFOL 10 MG/ML IV BOLUS
INTRAVENOUS | Status: DC | PRN
  Administered 2014-07-26: 180 mg via INTRAVENOUS

## 2014-07-26 MED ORDER — MIDAZOLAM HCL 2 MG/2ML IJ SOLN
INTRAMUSCULAR | Status: DC | PRN
  Administered 2014-07-26: 2 mg via INTRAVENOUS

## 2014-07-26 MED ORDER — FENTANYL CITRATE (PF) 100 MCG/2ML IJ SOLN
INTRAMUSCULAR | Status: DC | PRN
  Administered 2014-07-26: 50 ug via INTRAVENOUS

## 2014-07-26 SURGICAL SUPPLY — 24 items
BANDAGE ELASTIC 3 CLIP NS LF (GAUZE/BANDAGES/DRESSINGS) ×4 IMPLANT
BLADE SURG SZ11 CARB STEEL (BLADE) ×2 IMPLANT
BNDG GAUZE 4.5X4.1 6PLY STRL (MISCELLANEOUS) ×2 IMPLANT
BRIEF STRETCH MATERNITY 2XLG (MISCELLANEOUS) IMPLANT
CANISTER SUCT 1200ML W/VALVE (MISCELLANEOUS) ×2 IMPLANT
DRAIN PENROSE 1/4X12 LTX (DRAIN) ×2 IMPLANT
DRAIN PENROSE 5/8X12 LTX STRL (DRAIN) IMPLANT
DRAPE LAPAROTOMY 100X77 ABD (DRAPES) ×2 IMPLANT
DRAPE LEGGINS SURG 28X43 STRL (DRAPES) IMPLANT
DRAPE UNDER BUTTOCK W/FLU (DRAPES) IMPLANT
GAUZE SPONGE 4X4 12PLY STRL (GAUZE/BANDAGES/DRESSINGS) ×2 IMPLANT
GLOVE BIO SURGEON STRL SZ7.5 (GLOVE) ×8 IMPLANT
GLOVE INDICATOR 8.0 STRL GRN (GLOVE) ×8 IMPLANT
GOWN STRL REUS W/ TWL LRG LVL3 (GOWN DISPOSABLE) ×4 IMPLANT
GOWN STRL REUS W/TWL LRG LVL3 (GOWN DISPOSABLE) ×4
KIT RM TURNOVER CYSTO AR (KITS) ×2 IMPLANT
LABEL OR SOLS (LABEL) IMPLANT
NS IRRIG 500ML POUR BTL (IV SOLUTION) ×2 IMPLANT
PACK BASIN MINOR ARMC (MISCELLANEOUS) ×2 IMPLANT
PAD ABD DERMACEA PRESS 5X9 (GAUZE/BANDAGES/DRESSINGS) IMPLANT
PAD PREP 24X41 OB/GYN DISP (PERSONAL CARE ITEMS) IMPLANT
SUT ETHILON 3-0 FS-10 30 BLK (SUTURE) ×2
SUTURE EHLN 3-0 FS-10 30 BLK (SUTURE) ×1 IMPLANT
SWAB CULTURE AMIES ANAERIB BLU (MISCELLANEOUS) IMPLANT

## 2014-07-26 NOTE — Progress Notes (Signed)
Dr patel notified that surgeon to take pt to operating room for I&d . Discharge on hold at this time.

## 2014-07-26 NOTE — Transfer of Care (Signed)
Immediate Anesthesia Transfer of Care Note  Patient: Brian Lutz  Procedure(s) Performed: Procedure(s): INCISION AND DRAINAGE ABSCESS (Left)  Patient Location: PACU  Anesthesia Type:General  Level of Consciousness: sedated  Airway & Oxygen Therapy: Patient Spontanous Breathing and Patient connected to nasal cannula oxygen  Post-op Assessment: Report given to RN and Post -op Vital signs reviewed and stable  Post vital signs: Reviewed and stable  Last Vitals:  Filed Vitals:   07/26/14 1632  BP: 101/65  Pulse:   Temp: 36.8 C  Resp: 17    Complications: No apparent anesthesia complications

## 2014-07-26 NOTE — Brief Op Note (Signed)
07/24/2014 - 07/26/2014  4:23 PM  PATIENT:  Brian Lutz  39 y.o. male  PRE-OPERATIVE DIAGNOSIS:  abscess left leg  POST-OPERATIVE DIAGNOSIS: same  PROCEDURE:  Procedure(s): INCISION AND DRAINAGE ABSCESS (Left)  SURGEON:  Surgeon(s) and Role:    * Marlyce Huge, MD - Primary  PHYSICIAN ASSISTANT:   ASSISTANTS: none   ANESTHESIA:   general  EBL:  Total I/O In: 360 [P.O.:360] Out: 1405 [Urine:1400; Blood:5]  BLOOD ADMINISTERED:none  DRAINS: Penrose drain in the soft tissue   LOCAL MEDICATIONS USED:  NONE  SPECIMEN:  No Specimen  DISPOSITION OF SPECIMEN:  N/A  COUNTS:  YES  TOURNIQUET:  * No tourniquets in log *  DICTATION: .Note written in EPIC  PLAN OF CARE: Admit to inpatient   PATIENT DISPOSITION:  PACU - hemodynamically stable.   Delay start of Pharmacological VTE agent (>24hrs) due to surgical blood loss or risk of bleeding: not applicable

## 2014-07-26 NOTE — Care Management Note (Signed)
Case Management Note  Patient Details  Name: Brian Lutz MRN: [id number] Date of Birth: 07-Dec-1975  Subjective/Objective:      Out of room at this time for surgical I&D of a left lower leg abscess by Dr Rexene Edison.               Action/Plan:   Expected Discharge Date:  07/27/14               Expected Discharge Plan:     In-House Referral:     Discharge planning Services  CM Consult  Post Acute Care Choice:    Choice offered to:  Patient  DME Arranged:    DME Agency:     HH Arranged:    [place] Agency:     Status of Service:     Medicare Important Message Given:    Date Medicare IM Given:    Medicare IM give by:    Date Additional Medicare IM Given:    Additional Medicare Important Message give by:     If discussed at [place] of Stay Meetings, dates discussed:    Additional Comments:  Arel Tippen A, RN 07/26/2014, 3:59 PM

## 2014-07-26 NOTE — Progress Notes (Addendum)
[facility] at [place] NAME: Brian Lutz    MR#:  [id number]  DATE OF BIRTH:  [birth year]  SUBJECTIVE:  Left leg redness and pain. Had low grade fever 100 last nite  REVIEW OF SYSTEMS:   Review of Systems  Constitutional: Positive for fever. Negative for chills and weight loss.  HENT: Negative for ear discharge, ear pain and nosebleeds.   Eyes: Negative for blurred vision, pain and discharge.  Respiratory: Negative for sputum production, shortness of breath, wheezing and stridor.   Cardiovascular: Negative for chest pain, palpitations, orthopnea and PND.  Gastrointestinal: Negative for nausea, vomiting, abdominal pain and diarrhea.  Genitourinary: Negative for urgency and frequency.  Musculoskeletal: Positive for joint pain. Negative for back pain.  Skin: Positive for rash.  Neurological: Negative for sensory change, speech change, focal weakness and weakness.  Psychiatric/Behavioral: Negative for depression and hallucinations. The patient is not nervous/anxious.   All other systems reviewed and are negative.  Tolerating Diet:yes  DRUG ALLERGIES:  No Known Allergies  VITALS:  Blood pressure 106/66, pulse 79, temperature 98.4 F (36.9 C), temperature source Oral, resp. rate 18, height 6\' 1"  (1.854 m), weight 81.511 kg (179 lb 11.2 oz), SpO2 96 %.  PHYSICAL EXAMINATION:   Physical Exam  GENERAL:  39 y.o.-year-old patient lying in the bed with no acute distress.  EYES: Pupils equal, round, reactive to light and accommodation. No scleral icterus. Extraocular muscles intact.  HEENT: Head atraumatic, normocephalic. Oropharynx and nasopharynx clear.  NECK:  Supple, no jugular venous distention. No thyroid enlargement, no tenderness.  LUNGS: Normal breath sounds bilaterally, no wheezing, rales, rhonchi. No use of accessory muscles of respiration.  CARDIOVASCULAR: S1, S2 normal. No murmurs, rubs, or gallops.  ABDOMEN: Soft, nontender,  nondistended. Bowel sounds present. No organomegaly or mass.  EXTREMITIES: left tibial shin abscess with cellulitis, edema + left leg NEUROLOGIC: Cranial nerves II through XII are intact. No focal Motor or sensory deficits b/l.   PSYCHIATRIC: alert and oriented x 3.  SKIN:as above   LABORATORY PANEL:   CBC  Recent Labs Lab 07/25/14 0348  WBC 12.7*  HGB 13.3  HCT 38.9*  PLT 168    Chemistries   Recent Labs Lab 07/24/14 2011 07/25/14 0348  NA 135 136  K 3.3* 4.0  CL 100* 106  CO2 26 25  GLUCOSE 118* 106*  BUN 9 10  CREATININE 1.12 1.00  CALCIUM 8.9 8.2*  AST 24  --   ALT 17  --   ALKPHOS 87  --   BILITOT 1.2  --     Cardiac Enzymes No results for input(s): TROPONINI in the last 168 hours.  RADIOLOGY:  Dg Tibia/fibula Left  07/24/2014   CLINICAL DATA:  Erythema and swelling involving the anterior left lower leg which began 2 days ago and has progressively worsened.  EXAM: LEFT TIBIA AND FIBULA - 2 VIEW  COMPARISON:  None.  FINDINGS: Edema in the subcutaneous tissues of the anterior lower leg. No underlying osseous abnormality. Small bone island in the distal tibial metaphysis. No significant intrinsic osseous abnormality. Visualized knee joint and ankle joint intact.  IMPRESSION: No significant osseous abnormality.   Electronically Signed   By: Evangeline Dakin M.D.   On: 07/24/2014 20:39     ASSESSMENT AND PLAN:   Brian Lutz is a 39 y.o. male with a known history of asthma came into the ED with a chief complaint of swelling, redness and pain of the left  lower leg. He reports a boil developed on his left anterior shin several days ago, his uncle tried to drain it. Patient was febrile with a temp of 103 in the ED  1. Sepsis 2/2Left lower extending to cellulitis-meets septic criteria as patient is febrile and has leukocytosis -left leg elevation -Vancomycin IV-->change to po Bactrim DS at d/c -Dr Rexene Edison to take pt to OR for I and D -Wound cultures were not  sent on admisison -prn pain meds  2. History of asthma No exacerbation Provide nebulizers as needed basis for shortness of breath  Management plans discussed with the patient and in agreement.  CODE STATUS: full  DVT Prophylaxis: heparin  TOTAL TIME TAKING CARE OF THIS PATIENT: 35 minutes.  >50% time spent on counselling and coordination of care with pt  POSSIBLE D/C IN 1-2DAYS, DEPENDING ON CLINICAL CONDITION.   Brian Lutz M.D on 07/26/2014 at 12:18 PM  Between 7am to 6pm - Pager - [phone]  After 6pm go to [email or web address] - password [email or web address] [place]  [facility]  Office  [phone]  CC: Primary care physician; No PCP Per Patient

## 2014-07-26 NOTE — Progress Notes (Signed)
Dr Alvino Chapel to perform I&d at bedside. md orders lidocaine 1% ,sterile gloves and sterile guaze at bedside

## 2014-07-26 NOTE — Care Management Note (Signed)
Case Management Note  Patient Details  Name: Brian Lutz MRN: [id number] Date of Birth: Feb 18, 1975  Subjective/Objective:                  Met with patient to discuss discharge planning. Patient states he lives with his dad- he thinks in [facility]: [place]. [place], [place]. Patient does not have health insurance. He does not believe he will need DME or home health.   Action/Plan:  Application to Medication Management delivered to patient. Patient pending I & D today.   Expected Discharge Date:  07/27/14               Expected Discharge Plan:     In-House Referral:     Discharge planning Services  CM Consult  Post Acute Care Choice:    Choice offered to:  Patient  DME Arranged:    DME Agency:     HH Arranged:    [place] Agency:     Status of Service:     Medicare Important Message Given:    Date Medicare IM Given:    Medicare IM give by:    Date Additional Medicare IM Given:    Additional Medicare Important Message give by:     If discussed at [place] of Stay Meetings, dates discussed:    Additional Comments:  Marshell Garfinkel, RN 07/26/2014, 10:02 AM

## 2014-07-26 NOTE — Op Note (Signed)
Preoperative diagnosis: Left anterior leg abscess Postoperative diagnosis: Same Procedure performed: I and D of left leg abscess  Surgeon: Maxmillian Carsey  EBL: 5 ml Anesthesia: LMA, general Specimen: None Complications: None  Indication for surgery: Brian Lutz is a pleasant 39 yo who presents with 3 day history of left lower extremity cellulitis, pain and fluctuance.  He was brought to the OR for incision and drainage of left leg abscess  Details of procedure: Informed consent was obtained.  Brian Lutz was brought to the OR.  He was induced, LMA was placed and general anesthesia was administered.  His left leg was prepped and draped.  A timeout was performed.  An incision was made over the area of greatest fluctuance.  Purulence was expressed.  A hemostat was placed to break up loculations.  The wound extended caudally.  A counterincision was made and a penrose drain was placed through both incisions.  It was sutured in place with 3-0 nylon.  The wound was then irrigated and betadyne soaked packing was placed into the cavity.  The wound was dressed with 4x4 gauze, kerlix and ace wrapping.  The patient was then awoken, extubated and brought to the PACU.  There were no immediate complications.  Needle, sponge and instrument count were correct at the end of the procedure.

## 2014-07-26 NOTE — Consult Note (Signed)
CC: Left leg swelling, pain, drainage  HPI: Mr. Brian Lutz is a pleasant 39 yo M who presents with 2-3 days of LLE pain, swelling, drainage.  Was febrile to 103 in ER.  Uncle attempted initially to drain a small boil which it presented as and then it became red and swollen and painful.  On IV abx.  XR of leg showed no obvious abnormalities.  No fevers/chills, night sweats, shortness of breath, cough, chest pain, abdominal pain, nausea/vomiting, diarrhea/constipation, dysuria/hematuria.  Active Ambulatory Problems    Diagnosis Date Noted  . No Active Ambulatory Problems   Resolved Ambulatory Problems    Diagnosis Date Noted  . No Resolved Ambulatory Problems   Past Medical History  Diagnosis Date  . Asthma      Medication List    ASK your doctor about these medications        dicyclomine 20 MG tablet  Commonly known as:  BENTYL  Take 1 tablet (20 mg total) by mouth 4 (four) times daily -  before meals and at bedtime.     loperamide 2 MG capsule  Commonly known as:  IMODIUM  Take 1 capsule (2 mg total) by mouth 4 (four) times daily as needed for diarrhea or loose stools.       History   Social History  . Marital Status: Single    Spouse Name: N/A  . Number of Children: N/A  . Years of Education: N/A   Occupational History  . Not on file.   Social History Main Topics  . Smoking status: Former Research scientist (life sciences)  . Smokeless tobacco: Never Used  . Alcohol Use: No  . Drug Use: No  . Sexual Activity: Not on file   Other Topics Concern  . Not on file   Social History Narrative  History reviewed. No pertinent family history.   ROS: Full ROS obtained, pertinent positives and negatives as above.  Blood pressure 106/66, pulse 79, temperature 98.4 F (36.9 C), temperature source Oral, resp. rate 18, height 6\' 1"  (1.854 m), weight 81.511 kg (179 lb 11.2 oz), SpO2 96 %. GEN: NAD/A&Ox3 FACE: no obvious facial trauma, normal external nose, normal external ears EYES: no scleral icterus,  no conjunctivitis HEAD: normocephalic atraumatic CV: RRR, no MRG RESP: moving air well, lungs clear ABD: soft, nontender, nondistended EXT: moving all ext well, strength 5/5, left leg with approx 4 x 4 cm area of induration with punctate purulence NEURO: cnII-XII grossly intact, sensation intact all 4 ext  WBC: reviewed, currently significant for WBC 12.7  A/P 39 yo M admit with LLE swelling, pain.  I feel that he does have an undrained abscess on the anterior aspect of his leg.  I do not feel that he would tolerate bedside I and D due to its size and that any bedside I and D would likely be inadequate. Will plan on OR I and D.  I have spoken to him about the risks and benefits of this procedure and he would like to proceed.  Last PO at 8 am, will need to be NPO for 8 hours.

## 2014-07-26 NOTE — Progress Notes (Signed)
PT Cancellation Note  Patient Details Name: Brian Lutz MRN: [id number] DOB: 10-Dec-1975   Cancelled Treatment:    Reason Eval/Treat Not Completed: Other (comment) (See PT note for further details) Per chart review, it was found that pt will be having I & D procedure this PM. PT will attempt to see pt at later time/date after new orders are issued by MD after operation.   Janyth Contes 07/26/2014, 2:00 PM  Janyth Contes, SPT. [phone]

## 2014-07-26 NOTE — Progress Notes (Signed)
ANTIBIOTIC CONSULT NOTE - INITIAL  Pharmacy Consult for Vancomycin Indication: Cellulitis  No Known Allergies  Patient Measurements: Height: 6\' 1"  (185.4 cm) Weight: 179 lb 11.2 oz (81.511 kg) IBW/kg (Calculated) : 79.9 Adjusted Body Weight: n/a  Vital Signs: Temp: 98.4 F (36.9 C) (07/25 0737) Temp Source: Oral (07/25 0737) BP: 106/66 mmHg (07/25 0737) Pulse Rate: 79 (07/25 0737) Intake/Output from previous day: 07/24 0701 - 07/25 0700 In: 1840 [P.O.:840; IV Piggyback:1000] Out: 8088 [Urine:4050] Intake/Output from this shift: Total I/O In: 360 [P.O.:360] Out: 400 [Urine:400]  Labs:  Recent Labs  07/24/14 2011 07/25/14 0348  WBC 15.2* 12.7*  HGB 14.9 13.3  PLT 213 168  CREATININE 1.12 1.00   Estimated Creatinine Clearance: 114.3 mL/min (by C-G formula based on Cr of 1). No results for input(s): VANCOTROUGH, VANCOPEAK, VANCORANDOM, GENTTROUGH, GENTPEAK, GENTRANDOM, TOBRATROUGH, TOBRAPEAK, TOBRARND, AMIKACINPEAK, AMIKACINTROU, AMIKACIN in the last 72 hours.   Microbiology: Recent Results (from the past 720 hour(s))  Surgical pcr screen     Status: Abnormal   Collection Time: 07/26/14 10:44 AM  Result Value Ref Range Status   MRSA, PCR POSITIVE (A) NEGATIVE Final    Comment: CRITICAL RESULT CALLED TO, READ BACK BY AND VERIFIED WITH: KAREN DISON 07/26/2014 1310 [facility]    Staphylococcus aureus POSITIVE (A) NEGATIVE Final    Comment:        The Xpert SA Assay (FDA approved for NASAL specimens in patients over 47 years of age), is one component of a comprehensive surveillance program.  Test performance has been validated by [facility] for patients greater than or equal to 30 year old. It is not intended to diagnose infection nor to guide or monitor treatment.     Medical History: Past Medical History  Diagnosis Date  . Asthma     Medications:  Anti-infectives    Start     Dose/Rate Route Frequency Ordered Stop   07/26/14 1400  vancomycin (VANCOCIN)  1,500 mg in sodium chloride 0.9 % 500 mL IVPB     1,500 mg 250 mL/hr over 120 Minutes Intravenous Every 12 hours 07/26/14 1345     07/26/14 1000  sulfamethoxazole-trimethoprim (BACTRIM DS,SEPTRA DS) 800-160 MG per tablet 1 tablet  Status:  Discontinued     1 tablet Oral Every 12 hours 07/26/14 0938 07/26/14 1328   07/25/14 0900  vancomycin (VANCOCIN) 1,500 mg in sodium chloride 0.9 % 500 mL IVPB  Status:  Discontinued     1,500 mg 250 mL/hr over 120 Minutes Intravenous Every 12 hours 07/24/14 2204 07/26/14 0938   07/24/14 2130  vancomycin (VANCOCIN) 1,500 mg in sodium chloride 0.9 % 500 mL IVPB     1,500 mg 250 mL/hr over 120 Minutes Intravenous  Once 07/24/14 2128 07/24/14 2358     Assessment: Pharmacy consulted to dose vancomycin for cellulitis. Vanc d/c'd this AM (morning dose not given) planned to transition to bactrim, however now resuming vanc.   Goal of Therapy:  Vancomycin trough level 10-15 mcg/ml  Plan:  Will restart vancomycin 1500mg  IV Q12H.  Today's AM dose delayed approx 5 hrs, will reschedule trough for tomorrow after 3 consecutive doses. This should be fairly representative of steady state.   Rexene Edison, PharmD Clinical Pharmacist   07/26/2014 1:46 PM

## 2014-07-26 NOTE — Anesthesia Preprocedure Evaluation (Addendum)
Anesthesia Evaluation  Patient identified by MRN, date of birth, ID band Patient awake    Reviewed: Allergy & Precautions, NPO status , Patient's Chart, lab work & pertinent test results  Airway Mallampati: II       Dental no notable dental hx.    Pulmonary asthma , former smoker,    Pulmonary exam normal       Cardiovascular negative cardio ROS Normal cardiovascular exam    Neuro/Psych    GI/Hepatic negative GI ROS, Neg liver ROS,   Endo/Other  negative endocrine ROS  Renal/GU negative Renal ROS     Musculoskeletal negative musculoskeletal ROS (+)   Abdominal   Peds negative pediatric ROS (+)  Hematology negative hematology ROS (+)   Anesthesia Other Findings   Reproductive/Obstetrics negative OB ROS                            Anesthesia Physical Anesthesia Plan  ASA: II  Anesthesia Plan: General   Post-op Pain Management:    Induction: Intravenous  Airway Management Planned: LMA  Additional Equipment:   Intra-op Plan:   Post-operative Plan: Extubation in OR  Informed Consent: I have reviewed the patients History and Physical, chart, labs and discussed the procedure including the risks, benefits and alternatives for the proposed anesthesia with the patient or authorized representative who has indicated his/her understanding and acceptance.     Plan Discussed with: CRNA  Anesthesia Plan Comments:         Anesthesia Quick Evaluation

## 2014-07-27 ENCOUNTER — Inpatient Hospital Stay: Payer: Self-pay

## 2014-07-27 ENCOUNTER — Encounter: Payer: Self-pay | Admitting: Surgery

## 2014-07-27 DIAGNOSIS — I82402 Acute embolism and thrombosis of unspecified deep veins of left lower extremity: Secondary | ICD-10-CM | POA: Diagnosis not present

## 2014-07-27 LAB — CREATININE, SERUM
CREATININE: 0.9 mg/dL (ref 0.61–1.24)
GFR calc non Af Amer: >60 mL/min (ref >60)

## 2014-07-27 MED ORDER — RIVAROXABAN (XARELTO) EDUCATION KIT FOR DVT/PE PATIENTS: PACK | Freq: Once | Status: DC

## 2014-07-27 MED ORDER — BISACODYL 5 MG PO TBEC
10.0000 mg | DELAYED_RELEASE_TABLET | Freq: Every day | ORAL | Status: DC
  Administered 2014-07-27: 10 mg via ORAL
  Filled 2014-07-27: qty 2

## 2014-07-27 MED ORDER — MUPIROCIN 2 % EX OINT
1.0000 "application " | TOPICAL_OINTMENT | Freq: Two times a day (BID) | CUTANEOUS | Status: DC
  Administered 2014-07-27: 1 via NASAL
  Filled 2014-07-27 (×2): qty 22

## 2014-07-27 MED ORDER — CHLORHEXIDINE GLUCONATE CLOTH 2 % EX PADS: 6.0000 | MEDICATED_PAD | Freq: Every day | CUTANEOUS | Status: DC

## 2014-07-27 MED ORDER — SULFAMETHOXAZOLE-TRIMETHOPRIM 800-160 MG PO TABS
1.0000 | ORAL_TABLET | Freq: Two times a day (BID) | ORAL | Status: DC
  Administered 2014-07-27: 1 via ORAL
  Filled 2014-07-27: qty 1

## 2014-07-27 MED ORDER — SULFAMETHOXAZOLE-TRIMETHOPRIM 800-160 MG PO TABS: 1.0000 | ORAL_TABLET | Freq: Two times a day (BID) | ORAL | Status: DC

## 2014-07-27 MED ORDER — RIVAROXABAN (XARELTO) VTE STARTER PACK (15 & 20 MG)
ORAL_TABLET | ORAL | Status: DC
Start: 2014-07-27 — End: 2015-02-08

## 2014-07-27 MED ORDER — OXYCODONE HCL 5 MG PO TABS: 5.0000 mg | ORAL_TABLET | ORAL | Status: DC | PRN

## 2014-07-27 MED ORDER — RIVAROXABAN 15 MG PO TABS
15.0000 mg | ORAL_TABLET | Freq: Two times a day (BID) | ORAL | Status: DC
  Administered 2014-07-27: 15 mg via ORAL
  Filled 2014-07-27 (×2): qty 1

## 2014-07-27 NOTE — Progress Notes (Signed)
Surgery Progress Note  S: Sore.  No acute issues O: AF/VSS, good uop GEN: NAD/A&Ox3 EXT: LLE with penrose, still with some erythema/induration, left foot markedly swollen  A/P 39 yo s/p I and D of LLE abscess, doing well - cont abx x 10-14 days - bid dressing changes - would recommend LLE dopplers for leg swelling

## 2014-07-27 NOTE — Discharge Summary (Addendum)
[facility] [place] NAME: Brian Lutz    MR#:  [id number]  DATE OF BIRTH:  Jan 12, 1975  DATE OF ADMISSION:  07/24/2014 ADMITTING PHYSICIAN: Nicholes Mango, MD  DATE OF DISCHARGE: 07/27/14  PRIMARY CARE PHYSICIAN: No PCP Per Patient    ADMISSION DIAGNOSIS:  SIRS (systemic inflammatory response syndrome) [A41.9] Cellulitis of left lower extremity [L03.116]  DISCHARGE DIAGNOSIS:  Left LE abscess s/p I and D Left proximal femoral vein nonocclusive DVT  SECONDARY DIAGNOSIS:   Past Medical History  Diagnosis Date  . Asthma     HOSPITAL COURSE:  Brian Lutz is a 39 y.o. male with a known history of asthma came into the ED with a chief complaint of swelling, redness and pain of the left lower leg. He reports a boil developed on his left anterior shin several days ago, his uncle tried to drain it. Patient was febrile with a temp of 103 in the ED  1. Sepsis 2/2Left lower extending to cellulitis-meets septic criteria as patient is febrile and has leukocytosis -left leg elevation -Vancomycin IV-->change to po Bactrim DS at d/c -Dr Rexene Edison hep with I and D appreciated. F/u with dr Rexene Edison in 1 week -Wound cultures were not sent on admisison -prn pain meds  2. History of asthma No exacerbation Provide nebulizers as needed basis for shortness of breath  3. Left LE swelling  US doppler positive for left LE (proximal femoral nonocclusive DVT) D/w pt to be on xarelto and CM to arrange with [facility] for MEDICATION help  Overall improved PT recommends rolling walker DISCHARGE CONDITIONS:   fair CONSULTS OBTAINED:   Marlyce Huge, MD  DRUG ALLERGIES:  No Known Allergies  DISCHARGE MEDICATIONS:   Current Discharge Medication List    START taking these medications   Details  oxyCODONE (OXY IR/ROXICODONE) 5 MG immediate release tablet Take 1 tablet (5 mg total) by mouth every 4 (four) hours as needed for  moderate pain. Qty: 30 tablet, Refills: 0    sulfamethoxazole-trimethoprim (BACTRIM DS,SEPTRA DS) 800-160 MG per tablet Take 1 tablet by mouth every 12 (twelve) hours. Qty: 20 tablet, Refills: 0      STOP taking these medications     dicyclomine (BENTYL) 20 MG tablet      loperamide (IMODIUM) 2 MG capsule         If you experience worsening of your admission symptoms, develop shortness of breath, life threatening emergency, suicidal or homicidal thoughts you must seek medical attention immediately by calling 911 or calling your MD immediately  if symptoms less severe.  You Must read complete instructions/literature along with all the possible adverse reactions/side effects for all the Medicines you take and that have been prescribed to you. Take any new Medicines after you have completely understood and accept all the possible adverse reactions/side effects.   Please note  You were cared for by a hospitalist during your hospital stay. If you have any questions about your discharge medications or the care you received while you were in the hospital after you are discharged, you can call the unit and asked to speak with the hospitalist on call if the hospitalist that took care of you is not available. Once you are discharged, your primary care physician will handle any further medical issues. Please note that NO REFILLS for any discharge medications will be authorized once you are discharged, as it is imperative that you return to your primary care physician (or establish a relationship  with a primary care physician if you do not have one) for your aftercare needs so that they can reassess your need for medications and monitor your lab values. Today   SUBJECTIVE   Feels ok. No fever  VITAL SIGNS:  Blood pressure 98/70, pulse 80, temperature 98 F (36.7 C), temperature source Oral, resp. rate 18, height 6\' 1"  (1.854 m), weight 81.511 kg (179 lb 11.2 oz), SpO2 97 %.  I/O:     Intake/Output Summary (Last 24 hours) at 07/27/14 1347 Last data filed at 07/27/14 1334  Gross per 24 hour  Intake    550 ml  Output   3055 ml  Net  -2505 ml    PHYSICAL EXAMINATION:  GENERAL:  39 y.o.-year-old patient lying in the bed with no acute distress.  EYES: Pupils equal, round, reactive to light and accommodation. No scleral icterus. Extraocular muscles intact.  HEENT: Head atraumatic, normocephalic. Oropharynx and nasopharynx clear.  NECK:  Supple, no jugular venous distention. No thyroid enlargement, no tenderness.  LUNGS: Normal breath sounds bilaterally, no wheezing, rales,rhonchi or crepitation. No use of accessory muscles of respiration.  CARDIOVASCULAR: S1, S2 normal. No murmurs, rubs, or gallops.  ABDOMEN: Soft, non-tender, non-distended. Bowel sounds present. No organomegaly or mass.  EXTREMITIES: left LE surgical dressing + mild swelling+ NEUROLOGIC: Cranial nerves II through XII are intact. Muscle strength 5/5 in all extremities. Sensation intact. Gait not checked.  PSYCHIATRIC: The patient is alert and oriented x 3.  SKIN: No obvious rash, lesion, or ulcer.   DATA REVIEW:   CBC   Recent Labs Lab 07/25/14 0348  WBC 12.7*  HGB 13.3  HCT 38.9*  PLT 168    Chemistries   Recent Labs Lab 07/24/14 2011 07/25/14 0348 07/27/14 0447  NA 135 136  --   K 3.3* 4.0  --   CL 100* 106  --   CO2 26 25  --   GLUCOSE 118* 106*  --   BUN 9 10  --   CREATININE 1.12 1.00 0.90  CALCIUM 8.9 8.2*  --   AST 24  --   --   ALT 17  --   --   ALKPHOS 87  --   --   BILITOT 1.2  --   --     Microbiology Results   Recent Results (from the past 240 hour(s))  Surgical pcr screen     Status: Abnormal   Collection Time: 07/26/14 10:44 AM  Result Value Ref Range Status   MRSA, PCR POSITIVE (A) NEGATIVE Final    Comment: CRITICAL RESULT CALLED TO, READ BACK BY AND VERIFIED WITH: KAREN DISON 07/26/2014 1310 [facility]    Staphylococcus aureus POSITIVE (A) NEGATIVE Final     Comment:        The Xpert SA Assay (FDA approved for NASAL specimens in patients over 25 years of age), is one component of a comprehensive surveillance program.  Test performance has been validated by [facility] for patients greater than or equal to 2 year old. It is not intended to diagnose infection nor to guide or monitor treatment.     RADIOLOGY:  US Venous Img Lower Unilateral Left  07/27/2014   CLINICAL DATA:  Swelling of the left lower extremity since 07/22. History of sore involving the left calf.  EXAM: LEFT LOWER EXTREMITY VENOUS DOPPLER ULTRASOUND  TECHNIQUE: Gray-scale sonography with graded compression, as well as color Doppler and duplex ultrasound were performed to evaluate the lower extremity deep venous systems from  the level of the common femoral vein and including the common femoral, femoral, profunda femoral, popliteal and calf veins including the posterior tibial, peroneal and gastrocnemius veins when visible. The superficial great saphenous vein was also interrogated. Spectral Doppler was utilized to evaluate flow at rest and with distal augmentation maneuvers in the common femoral, femoral and popliteal veins.  COMPARISON:  None.  FINDINGS: Contralateral Common Femoral Vein: Respiratory phasicity is normal and symmetric with the symptomatic side. No evidence of thrombus. Normal compressibility.  Common Femoral Vein: No evidence of thrombus. Normal compressibility, respiratory phasicity and response to augmentation.  Saphenofemoral Junction: No evidence of thrombus. Normal compressibility and flow on color Doppler imaging.  Profunda Femoral Vein: No evidence of thrombus. Normal compressibility and flow on color Doppler imaging.  Femoral Vein: There is a minimal amount of age indeterminate eccentric hypoechoic nonocclusive wall thickening/DVT involving the proximal aspect of the left femoral vein (representative images 17, 18 and 34). The remainder of the left femoral  vein appears widely patent.  Popliteal Vein: No evidence of thrombus. Normal compressibility, respiratory phasicity and response to augmentation.  Calf Veins: No evidence of thrombus. Normal compressibility and flow on color Doppler imaging.  Superficial Great Saphenous Vein: No evidence of thrombus. Normal compressibility and flow on color Doppler imaging.  Venous Reflux:  None.  Other Findings: A benign appearing left inguinal lymph node correlates with the patient's palpable area of concern. This lymph node is not enlarged by size criteria measuring 1 cm in greatest short axis diameter and maintains a benign fatty hilum (image 41).  IMPRESSION: Minimal amount of age-indeterminate (though potentially chronic) eccentric hypoechoic nonocclusive wall thickening/DVT within the proximal left femoral vein. The remainder of the venous system with the left lower extremity appears widely patent.   Electronically Signed   By: Sandi Mariscal M.D.   On: 07/27/2014 13:36    Management plans discussed with the patient and in agreement.  CODE STATUS:     Code Status Orders        Start     Ordered   07/25/14 0047  Full code   Continuous     07/25/14 0046      TOTAL TIME TAKING CARE OF THIS PATIENT: 40 minutes.    Jodilyn Giese M.D on 07/27/2014 at 1:47 PM  Between 7am to 6pm - Pager - [phone] After 6pm go to [email or web address] - password [email or web address] [facility]  [facility]  Office  [phone]  CC: Primary care physician; No PCP Per Patient

## 2014-07-27 NOTE — Care Management (Signed)
Rolling walker delivered from [employer]. Coupon delivered to patient for 0$ co-pay every month. Appointment made with [facility] as [facility] did not have opening until September. His appointment is Aug. 3, 2016 at 11:45AM : [address] [place], [place] [address] [phone] fax  I have faxed this visit to Dr. Amil Amen at [facility].

## 2014-07-27 NOTE — Discharge Instructions (Signed)
Use rolling walker to ambulate as per PT instructions

## 2014-07-27 NOTE — Discharge Summary (Deleted)
[facility] at [place] NAME: Brian Lutz    MR#:  [id number]  DATE OF BIRTH:  August 08, 1975  DATE OF ADMISSION:  07/24/2014 ADMITTING PHYSICIAN: Nicholes Mango, MD  DATE OF DISCHARGE: 07/27/14  PRIMARY CARE PHYSICIAN: No PCP Per Patient    ADMISSION DIAGNOSIS:  SIRS (systemic inflammatory response syndrome) [A41.9] Cellulitis of left lower extremity [L03.116]  DISCHARGE DIAGNOSIS:  Left LE abscess s/p I and D  SECONDARY DIAGNOSIS:   Past Medical History  Diagnosis Date  . Asthma     HOSPITAL COURSE:  Brian Lutz is a 39 y.o. male with a known history of asthma came into the ED with a chief complaint of swelling, redness and pain of the left lower leg. He reports a boil developed on his left anterior shin several days ago, his uncle tried to drain it. Patient was febrile with a temp of 103 in the ED  1. Sepsis 2/2Left lower extending to cellulitis-meets septic criteria as patient is febrile and has leukocytosis -left leg elevation -Vancomycin IV-->change to po Bactrim DS at d/c -Dr Rexene Edison hep with I and D appreciated -Wound cultures were not sent on admisison -prn pain meds -[place] LE pending  2. History of asthma No exacerbation Provide nebulizers as needed basis for shortness of breath  Overall improved PT recommends using RW but it seems pt is declining DISCHARGE CONDITIONS:   fair CONSULTS OBTAINED:   Marlyce Huge, MD  DRUG ALLERGIES:  No Known Allergies  DISCHARGE MEDICATIONS:   Current Discharge Medication List    START taking these medications   Details  oxyCODONE (OXY IR/ROXICODONE) 5 MG immediate release tablet Take 1 tablet (5 mg total) by mouth every 4 (four) hours as needed for moderate pain. Qty: 30 tablet, Refills: 0    sulfamethoxazole-trimethoprim (BACTRIM DS,SEPTRA DS) 800-160 MG per tablet Take 1 tablet by mouth every 12 (twelve) hours. Qty: 20 tablet, Refills: 0      STOP taking  these medications     dicyclomine (BENTYL) 20 MG tablet      loperamide (IMODIUM) 2 MG capsule         If you experience worsening of your admission symptoms, develop shortness of breath, life threatening emergency, suicidal or homicidal thoughts you must seek medical attention immediately by calling 911 or calling your MD immediately  if symptoms less severe.  You Must read complete instructions/literature along with all the possible adverse reactions/side effects for all the Medicines you take and that have been prescribed to you. Take any new Medicines after you have completely understood and accept all the possible adverse reactions/side effects.   Please note  You were cared for by a hospitalist during your hospital stay. If you have any questions about your discharge medications or the care you received while you were in the hospital after you are discharged, you can call the unit and asked to speak with the hospitalist on call if the hospitalist that took care of you is not available. Once you are discharged, your primary care physician will handle any further medical issues. Please note that NO REFILLS for any discharge medications will be authorized once you are discharged, as it is imperative that you return to your primary care physician (or establish a relationship with a primary care physician if you do not have one) for your aftercare needs so that they can reassess your need for medications and monitor your lab values. Today   SUBJECTIVE   Feels  ok. No fever  VITAL SIGNS:  Blood pressure 98/70, pulse 80, temperature 98 F (36.7 C), temperature source Oral, resp. rate 18, height 6\' 1"  (1.854 m), weight 81.511 kg (179 lb 11.2 oz), SpO2 97 %.  I/O:   Intake/Output Summary (Last 24 hours) at 07/27/14 1329 Last data filed at 07/27/14 1137  Gross per 24 hour  Intake    550 ml  Output   2455 ml  Net  -1905 ml    PHYSICAL EXAMINATION:  GENERAL:  39 y.o.-year-old patient  lying in the bed with no acute distress.  EYES: Pupils equal, round, reactive to light and accommodation. No scleral icterus. Extraocular muscles intact.  HEENT: Head atraumatic, normocephalic. Oropharynx and nasopharynx clear.  NECK:  Supple, no jugular venous distention. No thyroid enlargement, no tenderness.  LUNGS: Normal breath sounds bilaterally, no wheezing, rales,rhonchi or crepitation. No use of accessory muscles of respiration.  CARDIOVASCULAR: S1, S2 normal. No murmurs, rubs, or gallops.  ABDOMEN: Soft, non-tender, non-distended. Bowel sounds present. No organomegaly or mass.  EXTREMITIES: left LE surgical dressing + mild swelling+ NEUROLOGIC: Cranial nerves II through XII are intact. Muscle strength 5/5 in all extremities. Sensation intact. Gait not checked.  PSYCHIATRIC: The patient is alert and oriented x 3.  SKIN: No obvious rash, lesion, or ulcer.   DATA REVIEW:   CBC   Recent Labs Lab 07/25/14 0348  WBC 12.7*  HGB 13.3  HCT 38.9*  PLT 168    Chemistries   Recent Labs Lab 07/24/14 2011 07/25/14 0348 07/27/14 0447  NA 135 136  --   K 3.3* 4.0  --   CL 100* 106  --   CO2 26 25  --   GLUCOSE 118* 106*  --   BUN 9 10  --   CREATININE 1.12 1.00 0.90  CALCIUM 8.9 8.2*  --   AST 24  --   --   ALT 17  --   --   ALKPHOS 87  --   --   BILITOT 1.2  --   --     Microbiology Results   Recent Results (from the past 240 hour(s))  Surgical pcr screen     Status: Abnormal   Collection Time: 07/26/14 10:44 AM  Result Value Ref Range Status   MRSA, PCR POSITIVE (A) NEGATIVE Final    Comment: CRITICAL RESULT CALLED TO, READ BACK BY AND VERIFIED WITH: KAREN DISON 07/26/2014 1310 [facility]    Staphylococcus aureus POSITIVE (A) NEGATIVE Final    Comment:        The Xpert SA Assay (FDA approved for NASAL specimens in patients over 48 years of age), is one component of a comprehensive surveillance program.  Test performance has been validated by [facility] for  patients greater than or equal to 62 year old. It is not intended to diagnose infection nor to guide or monitor treatment.     RADIOLOGY:  No results found.   Management plans discussed with the patient, family and they are in agreement.  CODE STATUS:     Code Status Orders        Start     Ordered   07/25/14 0047  Full code   Continuous     07/25/14 0046      TOTAL TIME TAKING CARE OF THIS PATIENT: 40 minutes.    Essence Merle M.D on 07/27/2014 at 1:29 PM  Between 7am to 6pm - Pager - [phone] After 6pm go to [email or web address] - [place]  [facility]  Office  [phone]  CC: Primary care physician; No PCP Per Patient

## 2014-07-27 NOTE — Anesthesia Postprocedure Evaluation (Signed)
  Anesthesia Post-op Note  Patient: Brian KERNEN  Procedure(s) Performed: Procedure(s): INCISION AND DRAINAGE LEFT LEG ABSCESS (Left)  Anesthesia type:General  Patient location: PACU  Post pain: Pain level controlled  Post assessment: Post-op Vital signs reviewed, Patient's Cardiovascular Status Stable, Respiratory Function Stable, Patent Airway and No signs of Nausea or vomiting  Post vital signs: Reviewed and stable  Last Vitals:  Filed Vitals:   07/27/14 0502  BP: 86/56  Pulse: 74  Temp: 36.9 C  Resp: 20    Level of consciousness: awake, alert  and patient cooperative  Complications: No apparent anesthesia complications

## 2014-07-27 NOTE — Progress Notes (Signed)
[place] [facility] [place] NAME: Brian Lutz    MR#:  [id number]  DATE OF BIRTH:  24-Sep-1975  SUBJECTIVE:  Left leg redness and pain. Had low grade fever 100 last nite  REVIEW OF SYSTEMS:   Review of Systems  Constitutional: Positive for fever. Negative for chills and weight loss.  HENT: Negative for ear discharge, ear pain and nosebleeds.   Eyes: Negative for blurred vision, pain and discharge.  Respiratory: Negative for sputum production, shortness of breath, wheezing and stridor.   Cardiovascular: Negative for chest pain, palpitations, orthopnea and PND.  Gastrointestinal: Negative for nausea, vomiting, abdominal pain and diarrhea.  Genitourinary: Negative for urgency and frequency.  Musculoskeletal: Positive for joint pain. Negative for back pain.  Skin: Positive for rash.  Neurological: Negative for sensory change, speech change, focal weakness and weakness.  Psychiatric/Behavioral: Negative for depression and hallucinations. The patient is not nervous/anxious.   All other systems reviewed and are negative.  Tolerating Diet:yes  DRUG ALLERGIES:  No Known Allergies  VITALS:  Blood pressure 98/70, pulse 80, temperature 98 F (36.7 C), temperature source Oral, resp. rate 18, height 6\' 1"  (1.854 m), weight 81.511 kg (179 lb 11.2 oz), SpO2 97 %.  PHYSICAL EXAMINATION:   Physical Exam  GENERAL:  39 y.o.-year-old patient lying in the bed with no acute distress.  EYES: Pupils equal, round, reactive to light and accommodation. No scleral icterus. Extraocular muscles intact.  HEENT: Head atraumatic, normocephalic. Oropharynx and nasopharynx clear.  NECK:  Supple, no jugular venous distention. No thyroid enlargement, no tenderness.  LUNGS: Normal breath sounds bilaterally, no wheezing, rales, rhonchi. No use of accessory muscles of respiration.  CARDIOVASCULAR: S1, S2 normal. No murmurs, rubs, or gallops.  ABDOMEN: Soft, nontender,  nondistended. Bowel sounds present. No organomegaly or mass.  EXTREMITIES: left tibial shin abscess with cellulitis, edema + left leg NEUROLOGIC: Cranial nerves II through XII are intact. No focal Motor or sensory deficits b/l.   PSYCHIATRIC: alert and oriented x 3.  SKIN:as above   LABORATORY PANEL:   CBC  Recent Labs Lab 07/25/14 0348  WBC 12.7*  HGB 13.3  HCT 38.9*  PLT 168    Chemistries   Recent Labs Lab 07/24/14 2011 07/25/14 0348 07/27/14 0447  NA 135 136  --   K 3.3* 4.0  --   CL 100* 106  --   CO2 26 25  --   GLUCOSE 118* 106*  --   BUN 9 10  --   CREATININE 1.12 1.00 0.90  CALCIUM 8.9 8.2*  --   AST 24  --   --   ALT 17  --   --   ALKPHOS 87  --   --   BILITOT 1.2  --   --     Cardiac Enzymes No results for input(s): TROPONINI in the last 168 hours.  RADIOLOGY:  No results found.   ASSESSMENT AND PLAN:   [name] is a 39 y.o. male with a known history of asthma came into the ED with a chief complaint of swelling, redness and pain of the left lower leg. He reports a boil developed on his left anterior shin several days ago, his uncle tried to drain it. Patient was febrile with a temp of 103 in the ED  1. Sepsis 2/2Left lower extending to cellulitis-meets septic criteria as patient is febrile and has leukocytosis -left leg elevation -Vancomycin IV-->change to po Bactrim DS at d/c -Dr Rexene Edison  to take pt to OR for I and D -Wound cultures were not sent on admisison -prn pain meds  2. History of asthma No exacerbation Provide nebulizers as needed basis for shortness of breath  Management plans discussed with the patient and in agreement.  CODE STATUS: full  DVT Prophylaxis: heparin  TOTAL TIME TAKING CARE OF THIS PATIENT: 35 minutes.  >50% time spent on counselling and coordination of care with pt  POSSIBLE D/C IN 1-2DAYS, DEPENDING ON CLINICAL CONDITION.   Keyoni Lapinski M.D on 07/27/2014 at 12:49 PM  Between 7am to 6pm - Pager -  [phone]  After 6pm go to [email or web address] - password [email or web address] [facility]  [facility]  Office  [phone]  CC: Primary care physician; No PCP Per Patient

## 2014-08-05 ENCOUNTER — Ambulatory Visit: Payer: Self-pay | Admitting: Surgery

## 2015-02-05 ENCOUNTER — Inpatient Hospital Stay
Admission: EM | Admit: 2015-02-05 | Discharge: 2015-02-08 | DRG: 603 | Disposition: A | Discharge status: Home / Self Care | Payer: Self-pay | Attending: Internal Medicine | Admitting: Internal Medicine

## 2015-02-05 ENCOUNTER — Encounter: Payer: Self-pay | Admitting: Emergency Medicine

## 2015-02-05 DIAGNOSIS — K589 Irritable bowel syndrome without diarrhea: Secondary | ICD-10-CM | POA: Diagnosis present

## 2015-02-05 DIAGNOSIS — L0390 Cellulitis, unspecified: Secondary | ICD-10-CM | POA: Diagnosis present

## 2015-02-05 DIAGNOSIS — Z8249 Family history of ischemic heart disease and other diseases of the circulatory system: Secondary | ICD-10-CM

## 2015-02-05 DIAGNOSIS — Z8614 Personal history of Methicillin resistant Staphylococcus aureus infection: Secondary | ICD-10-CM

## 2015-02-05 DIAGNOSIS — J45909 Unspecified asthma, uncomplicated: Secondary | ICD-10-CM | POA: Diagnosis present

## 2015-02-05 DIAGNOSIS — L0292 Furuncle, unspecified: Secondary | ICD-10-CM

## 2015-02-05 DIAGNOSIS — Z825 Family history of asthma and other chronic lower respiratory diseases: Secondary | ICD-10-CM

## 2015-02-05 DIAGNOSIS — L02512 Cutaneous abscess of left hand: Secondary | ICD-10-CM | POA: Diagnosis present

## 2015-02-05 DIAGNOSIS — L03116 Cellulitis of left lower limb: Secondary | ICD-10-CM | POA: Diagnosis present

## 2015-02-05 DIAGNOSIS — B9561 Methicillin susceptible Staphylococcus aureus infection as the cause of diseases classified elsewhere: Secondary | ICD-10-CM | POA: Diagnosis present

## 2015-02-05 DIAGNOSIS — R110 Nausea: Secondary | ICD-10-CM

## 2015-02-05 DIAGNOSIS — L02214 Cutaneous abscess of groin: Principal | ICD-10-CM

## 2015-02-05 DIAGNOSIS — R300 Dysuria: Secondary | ICD-10-CM | POA: Diagnosis present

## 2015-02-05 DIAGNOSIS — L03314 Cellulitis of groin: Secondary | ICD-10-CM | POA: Diagnosis present

## 2015-02-05 DIAGNOSIS — Z9049 Acquired absence of other specified parts of digestive tract: Secondary | ICD-10-CM

## 2015-02-05 DIAGNOSIS — Z86718 Personal history of other venous thrombosis and embolism: Secondary | ICD-10-CM

## 2015-02-05 LAB — COMPREHENSIVE METABOLIC PANEL
ALT: 19 U/L (ref 17–63)
AST: 23 U/L (ref 15–41)
Albumin: 4 g/dL (ref 3.5–5.0)
Alkaline Phosphatase: 89 U/L (ref 38–126)
Anion gap: 10 (ref 5–15)
BUN: 10 mg/dL (ref 6–20)
CHLORIDE: 106 mmol/L (ref 101–111)
CO2: 23 mmol/L (ref 22–32)
Calcium: 8.9 mg/dL (ref 8.9–10.3)
Creatinine, Ser: 0.81 mg/dL (ref 0.61–1.24)
Glucose, Bld: 108 mg/dL — ABNORMAL HIGH (ref 65–99)
Potassium: 3.8 mmol/L (ref 3.5–5.1)
Sodium: 139 mmol/L (ref 135–145)
Total Bilirubin: 0.7 mg/dL (ref 0.3–1.2)
Total Protein: 7.4 g/dL (ref 6.5–8.1)

## 2015-02-05 LAB — URINALYSIS COMPLETE WITH MICROSCOPIC (ARMC ONLY)
BILIRUBIN URINE: NEGATIVE
Bacteria, UA: NONE SEEN
Glucose, UA: NEGATIVE mg/dL
HGB URINE DIPSTICK: NEGATIVE
KETONES UR: NEGATIVE mg/dL
LEUKOCYTES UA: NEGATIVE
Nitrite: NEGATIVE
PH: 5 (ref 5.0–8.0)
Protein, ur: NEGATIVE mg/dL
Specific Gravity, Urine: 1.028 (ref 1.005–1.030)

## 2015-02-05 LAB — CBC WITH DIFFERENTIAL/PLATELET
Basophils Absolute: 0 10*3/uL (ref 0–0.1)
Basophils Relative: 1 %
EOS ABS: 0.3 10*3/uL (ref 0–0.7)
Eosinophils Relative: 3 %
HCT: 43.1 % (ref 40.0–52.0)
HEMOGLOBIN: 14.8 g/dL (ref 13.0–18.0)
LYMPHS ABS: 1.2 10*3/uL (ref 1.0–3.6)
LYMPHS PCT: 13 %
MCH: 30.3 pg (ref 26.0–34.0)
MCHC: 34.2 g/dL (ref 32.0–36.0)
MCV: 88.5 fL (ref 80.0–100.0)
Monocytes Absolute: 0.7 10*3/uL (ref 0.2–1.0)
Monocytes Relative: 8 %
NEUTROS PCT: 75 %
Neutro Abs: 6.9 10*3/uL — ABNORMAL HIGH (ref 1.4–6.5)
Platelets: 188 10*3/uL (ref 150–440)
RBC: 4.87 MIL/uL (ref 4.40–5.90)
RDW: 12.4 % (ref 11.5–14.5)
WBC: 9.2 10*3/uL (ref 3.8–10.6)

## 2015-02-05 LAB — HEMOGLOBIN A1C: Hgb A1c MFr Bld: 5.3 % (ref 4.0–6.0)

## 2015-02-05 MED ORDER — LIDOCAINE-EPINEPHRINE 2 %-1:100000 IJ SOLN: 30.0000 mL | Freq: Once | INTRAMUSCULAR | Status: DC

## 2015-02-05 MED ORDER — LIDOCAINE-EPINEPHRINE (PF) 1 %-1:200000 IJ SOLN
INTRAMUSCULAR | Status: AC
  Administered 2015-02-05: 15:00:00
  Filled 2015-02-05: qty 30

## 2015-02-05 MED ORDER — ONDANSETRON HCL 4 MG/2ML IJ SOLN
4.0000 mg | Freq: Once | INTRAMUSCULAR | Status: AC
  Administered 2015-02-05: 4 mg via INTRAVENOUS
  Filled 2015-02-05: qty 2

## 2015-02-05 MED ORDER — ONDANSETRON HCL 4 MG PO TABS: 4.0000 mg | ORAL_TABLET | Freq: Four times a day (QID) | ORAL | Status: DC | PRN

## 2015-02-05 MED ORDER — MORPHINE SULFATE (PF) 4 MG/ML IV SOLN
4.0000 mg | Freq: Once | INTRAVENOUS | Status: AC
  Administered 2015-02-05: 4 mg via INTRAVENOUS
  Filled 2015-02-05: qty 1

## 2015-02-05 MED ORDER — DOCUSATE SODIUM 100 MG PO CAPS
100.0000 mg | ORAL_CAPSULE | Freq: Two times a day (BID) | ORAL | Status: DC
  Administered 2015-02-05 – 2015-02-08 (×6): 100 mg via ORAL
  Filled 2015-02-05 (×6): qty 1

## 2015-02-05 MED ORDER — VANCOMYCIN HCL IN DEXTROSE 1-5 GM/200ML-% IV SOLN
1000.0000 mg | Freq: Once | INTRAVENOUS | Status: AC
  Administered 2015-02-05: 1000 mg via INTRAVENOUS
  Filled 2015-02-05: qty 200

## 2015-02-05 MED ORDER — OXYCODONE HCL 5 MG PO TABS
5.0000 mg | ORAL_TABLET | ORAL | Status: DC | PRN
  Administered 2015-02-05 – 2015-02-08 (×8): 5 mg via ORAL
  Filled 2015-02-05 (×8): qty 1

## 2015-02-05 MED ORDER — BISACODYL 10 MG RE SUPP: 10.0000 mg | Freq: Every day | RECTAL | Status: DC | PRN

## 2015-02-05 MED ORDER — ACETAMINOPHEN 325 MG PO TABS: 650.0000 mg | ORAL_TABLET | Freq: Four times a day (QID) | ORAL | Status: DC | PRN

## 2015-02-05 MED ORDER — MORPHINE SULFATE (PF) 2 MG/ML IV SOLN
2.0000 mg | INTRAVENOUS | Status: DC | PRN
  Administered 2015-02-06 (×3): 2 mg via INTRAVENOUS
  Filled 2015-02-05 (×3): qty 1

## 2015-02-05 MED ORDER — ASPIRIN EC 81 MG PO TBEC
81.0000 mg | DELAYED_RELEASE_TABLET | Freq: Every day | ORAL | Status: DC
  Administered 2015-02-05 – 2015-02-08 (×4): 81 mg via ORAL
  Filled 2015-02-05 (×4): qty 1

## 2015-02-05 MED ORDER — SODIUM CHLORIDE 0.9 % IV SOLN
INTRAVENOUS | Status: DC
  Administered 2015-02-05 – 2015-02-07 (×4): via INTRAVENOUS

## 2015-02-05 MED ORDER — CLINDAMYCIN PHOSPHATE 600 MG/50ML IV SOLN
600.0000 mg | Freq: Three times a day (TID) | INTRAVENOUS | Status: DC
  Administered 2015-02-05 – 2015-02-08 (×8): 600 mg via INTRAVENOUS
  Filled 2015-02-05 (×10): qty 50

## 2015-02-05 MED ORDER — PANTOPRAZOLE SODIUM 40 MG PO TBEC
40.0000 mg | DELAYED_RELEASE_TABLET | Freq: Every day | ORAL | Status: DC
  Administered 2015-02-05 – 2015-02-08 (×4): 40 mg via ORAL
  Filled 2015-02-05 (×4): qty 1

## 2015-02-05 MED ORDER — ONDANSETRON HCL 4 MG/2ML IJ SOLN
4.0000 mg | Freq: Four times a day (QID) | INTRAMUSCULAR | Status: DC | PRN
  Administered 2015-02-08: 4 mg via INTRAVENOUS
  Filled 2015-02-05: qty 2

## 2015-02-05 MED ORDER — CLINDAMYCIN PHOSPHATE 600 MG/50ML IV SOLN
600.0000 mg | Freq: Once | INTRAVENOUS | Status: AC
  Administered 2015-02-05: 600 mg via INTRAVENOUS
  Filled 2015-02-05: qty 50

## 2015-02-05 MED ORDER — HEPARIN SODIUM (PORCINE) 5000 UNIT/ML IJ SOLN
5000.0000 [IU] | Freq: Three times a day (TID) | INTRAMUSCULAR | Status: DC
  Administered 2015-02-05: 5000 [IU] via SUBCUTANEOUS
  Filled 2015-02-05 (×5): qty 1

## 2015-02-05 MED ORDER — LIDOCAINE-EPINEPHRINE (PF) 2 %-1:200000 IJ SOLN: 20.0000 mL | Freq: Once | INTRAMUSCULAR | Status: DC

## 2015-02-05 MED ORDER — CLINDAMYCIN PHOSPHATE 600 MG/50ML IV SOLN
600.0000 mg | Freq: Three times a day (TID) | INTRAVENOUS | Status: DC
  Filled 2015-02-05: qty 50

## 2015-02-05 MED ORDER — ACETAMINOPHEN 650 MG RE SUPP: 650.0000 mg | Freq: Four times a day (QID) | RECTAL | Status: DC | PRN

## 2015-02-05 NOTE — H&P (Signed)
History and Physical    Brian Lutz [id number] DOB: [birth year] DOA: 02/05/2015  Referring physician: Dr. Reita Cliche PCP: No PCP Per Patient  Specialists: none  Chief Complaint: left groin pain with nausea  HPI: Brian Lutz is a 40 y.o. male has a past medical history significant for LLE DVT with hx of MRSA cellulitis now with 3-4 day hx of pain in left groin with abscess formation. Pain is worsening. Redness is spreading. Having nausea and dizziness. No fever. Came to ER where he was found to have multiple abscesses with the largest one in the left groin. In the ER, the erythema of the left thigh was extending rapidly. I and D was performed by the ER MD and he was started on IV ABX. He is now admitted. He denies fever, vomiting, or diarrhea. Denies hx of DM.  Review of Systems: The patient denies anorexia, fever, weight loss,, vision loss, decreased hearing, hoarseness, chest pain, syncope, dyspnea on exertion, peripheral edema, balance deficits, hemoptysis, abdominal pain, melena, hematochezia, severe indigestion/heartburn, hematuria, incontinence, genital sores, muscle weakness,  transient blindness, difficulty walking, depression, unusual weight change, abnormal bleeding, enlarged lymph nodes, angioedema, and breast masses.   Past Medical History  Diagnosis Date  . Asthma    Past Surgical History  Procedure Laterality Date  . Cholecystectomy    . Incision and drainage abscess Left 07/26/2014    Procedure: INCISION AND DRAINAGE LEFT LEG ABSCESS;  Surgeon: Marlyce Huge, MD;  Location: [facility] ORS;  Service: General;  Laterality: Left;   Social History:  reports that he has quit smoking. He has never used smokeless tobacco. He reports that he does not drink alcohol or use illicit drugs.  No Known Allergies  Family History  Problem Relation Age of Onset  . Hypertension Mother   . Asthma Brother     Prior to Admission medications   Medication Sig Start Date End Date  Taking? Authorizing Provider  oxyCODONE (OXY IR/ROXICODONE) 5 MG immediate release tablet Take 1 tablet (5 mg total) by mouth every 4 (four) hours as needed for moderate pain. 07/27/14   Fritzi Mandes, MD  Rivaroxaban (XARELTO STARTER PACK) 15 & 20 MG TBPK Take as directed on package: Start with one 15mg  tablet by mouth twice a day with food. On Day 22, switch to one 20mg  tablet once a day with food. 07/27/14   Fritzi Mandes, MD  sulfamethoxazole-trimethoprim (BACTRIM DS,SEPTRA DS) 800-160 MG per tablet Take 1 tablet by mouth every 12 (twelve) hours. 07/27/14   Fritzi Mandes, MD   Physical Exam: Filed Vitals:   02/05/15 1130 02/05/15 1330 02/05/15 1442 02/05/15 1445  BP: 122/76  114/83   Pulse: 106 68  74  Temp:      TempSrc:      Resp:      Height:      Weight:      SpO2: 97% 97%  100%     General:  No apparent distress  Eyes: PERRL, EOMI, no scleral icterus, conjunctiva clear  ENT: moist oropharynx  Neck: supple, no lymphadenopathy. No JVD or thyromegaly noted  Cardiovascular: regular rate without MRG; 2+ peripheral pulses, no JVD, no peripheral edema  Respiratory: CTA biL, good air movement without wheezing, rhonchi or crackled  Abdomen: soft, non tender to palpation, positive bowel sounds, no guarding, no rebound  Skin: severe erythema of left inner thigh extending now down to the knee. Groin abscess noted with tenderness to palpation. Also noted were healing wounds on his right  hand and left shin. He has also active infection/cellulitis of left 2nd finger  Musculoskeletal: normal bulk and tone, no joint swelling  Psychiatric: normal mood and affect, A and O x 3  Neurologic: CN 2-12 grossly intact, motor strentgh 5/5 in all 4 groups. Sensory exam normal and DTR's symmetric  Labs on Admission:  Basic Metabolic Panel:  Recent Labs Lab 02/05/15 1157  NA 139  K 3.8  CL 106  CO2 23  GLUCOSE 108*  BUN 10  CREATININE 0.81  CALCIUM 8.9   Liver Function Tests:  Recent  Labs Lab 02/05/15 1157  AST 23  ALT 19  ALKPHOS 89  BILITOT 0.7  PROT 7.4  ALBUMIN 4.0   No results for input(s): LIPASE, AMYLASE in the last 168 hours. No results for input(s): AMMONIA in the last 168 hours. CBC:  Recent Labs Lab 02/05/15 1157  WBC 9.2  NEUTROABS 6.9*  HGB 14.8  HCT 43.1  MCV 88.5  PLT 188   Cardiac Enzymes: No results for input(s): CKTOTAL, CKMB, CKMBINDEX, TROPONINI in the last 168 hours.  BNP (last 3 results) No results for input(s): BNP in the last 8760 hours.  ProBNP (last 3 results) No results for input(s): PROBNP in the last 8760 hours.  CBG: No results for input(s): GLUCAP in the last 168 hours.  Radiological Exams on Admission: No results found.  EKG: Independently reviewed.  Assessment/Plan Principal Problem:   Cellulitis of left leg Active Problems:   Abscess of groin, left   Nausea   Hx MRSA infection   Cultures sent. Will begin IV fluids and IV ABX. Consult ID. Evaluate for immune deficiency and/or DM. Repeat labs in AM  Diet: regular Fluids: NS@125  DVT Prophylaxis: SQ Heparin  Code Status: FULL  Family Communication: none  Disposition Plan: home  Time spent: 50 min

## 2015-02-05 NOTE — ED Notes (Addendum)
Pt reports back pain and some difficulty urinating. Also reports tingling in feet, multiple abscess noted to hands, legs, and groin. States " i think i might have kidney disease"  Has had to have multiple abscess removed in past. Has "problems eating and vomited yellow bile yesterday" had gallbladder removed

## 2015-02-05 NOTE — ED Notes (Signed)
MD at bedside. 

## 2015-02-05 NOTE — Progress Notes (Signed)
Pt admitted from the ED. VSS; Given pain meds once for left groin pain; Pt bleeding from I@D  to left groin; Pressure was held for 20 minutes with dry 4X4 and tegaderm. Pt tolerating diet. Pt instructed to lay flat on back and minimize movement in the left leg until the bleeding has clotted. Report given to oncoming RN with all questions answered.

## 2015-02-05 NOTE — ED Notes (Signed)
Patient presents to the ED with multiple abscesses to his hand and groin, headache, tingling in his feet and occasional vomiting.  Patient reports vomiting x 1 yesterday.  Patient is ambulatory to triage and is in no obvious distress at this time.  Patient given resources at this time regarding medication management clinic and [facility].

## 2015-02-05 NOTE — ED Provider Notes (Signed)
[facility] Emergency Department Provider Note   ____________________________________________  Time seen: Approximately 11:45 AM I have reviewed the triage vital signs and the triage nursing note.  HISTORY  Chief Complaint Abscess; Emesis; and Headache   Historian Patient  HPI Brian Lutz is a 40 y.o. male who is here for evaluation of left groin abscess, which he first noticed about 2 days ago. He did have a small early abscess on the perineum about 2 weeks ago that he took 3 extra antibiotic pills that he had on hand and that seemed to get better. 2 days ago he started having new pain and swelling to an early abscess that is now larger and more painful. There is now another spot just below the larger abscess. He also has a small early abscess to his left fourth finger dorsally. He states he's had nausea and intermittent vomiting for about 2 days as well. No fever. He's had occasional left flank pain for several weeks. He is reporting some dysuria. Denies fevers. Denies dizziness or passing out. Pain is moderate. No exacerbating or alleviating factors.    Past Medical History  Diagnosis Date  . Asthma     Patient Active Problem List   Diagnosis Date Noted  . Left leg DVT ([place]) 07/27/2014  . Cellulitis of left leg 07/24/2014    Past Surgical History  Procedure Laterality Date  . Cholecystectomy    . Incision and drainage abscess Left 07/26/2014    Procedure: INCISION AND DRAINAGE LEFT LEG ABSCESS;  Surgeon: Marlyce Huge, MD;  Location: [facility] ORS;  Service: General;  Laterality: Left;    Current Outpatient Rx  Name  Route  Sig  Dispense  Refill  . oxyCODONE (OXY IR/ROXICODONE) 5 MG immediate release tablet   Oral   Take 1 tablet (5 mg total) by mouth every 4 (four) hours as needed for moderate pain.   30 tablet   0   . Rivaroxaban (XARELTO STARTER PACK) 15 & 20 MG TBPK      Take as directed on package: Start with one 15mg  tablet by  mouth twice a day with food. On Day 22, switch to one 20mg  tablet once a day with food.   51 each   0   . sulfamethoxazole-trimethoprim (BACTRIM DS,SEPTRA DS) 800-160 MG per tablet   Oral   Take 1 tablet by mouth every 12 (twelve) hours.   20 tablet   0     Allergies Review of patient's allergies indicates no known allergies.  No family history on file.  Social History Social History  Substance Use Topics  . Smoking status: Former Research scientist (life sciences)  . Smokeless tobacco: Never Used  . Alcohol Use: No    Review of Systems  Constitutional: Negative for fever. Eyes: Negative for visual changes. ENT: Negative for sore throat. Cardiovascular: Negative for chest pain. Respiratory: Negative for shortness of breath. Gastrointestinal: Negative for abdominal pain, vomiting and diarrhea. Genitourinary: Negative for dysuria. Musculoskeletal: Intermittent left flank pain.. Skin: Overall/abscess with cellulitis to left groin Neurological: Negative for headache. 10 point Review of Systems otherwise negative ____________________________________________   PHYSICAL EXAM:  VITAL SIGNS: ED Triage Vitals  Enc Vitals Group     BP 02/05/15 1117 142/68 mmHg     Pulse Rate 02/05/15 1117 96     Resp 02/05/15 1117 16     Temp 02/05/15 1117 97.5 F (36.4 C)     Temp Source 02/05/15 1117 Oral     SpO2 02/05/15 1117 100 %  Weight 02/05/15 1117 195 lb (88.451 kg)     Height 02/05/15 1117 6\' 1"  (1.854 m)     Head Cir --      Peak Flow --      Pain Score 02/05/15 1118 8     Pain Loc --      Pain Edu? --      Excl. in [place]? --      Constitutional: Alert and oriented. Well appearing and in no distress. HEENT   Head: Normocephalic and atraumatic.      Eyes: Conjunctivae are normal. PERRL. Normal extraocular movements.      Ears:         Nose: No congestion/rhinnorhea.   Mouth/Throat: Mucous membranes are moist.   Neck: No stridor. Cardiovascular/Chest: Normal rate, regular rhythm.   No murmurs, rubs, or gallops. Respiratory: Normal respiratory effort without tachypnea nor retractions. Breath sounds are clear and equal bilaterally. No wheezes/rales/rhonchi. Gastrointestinal: Soft. No distention, no guarding, no rebound. Nontender.    Genitourinary/rectal: Left groin abscess which is swollen, fluctuant, and red along the skin. There is a tiny pinpoint early abscess posteriorly on the left buttock. Musculoskeletal: Nontender with normal range of motion in all extremities. No joint effusions.  No lower extremity tenderness.  No edema. Left fourth finger near the MCP is a small early abscess. Neurologic:  Normal speech and language. No gross or focal neurologic deficits are appreciated. Skin:  Skin is warm, dry and intact. Cellulitis left groin. No involvement to the scrotum or penis. Psychiatric: Mood and affect are normal. Speech and behavior are normal. Patient exhibits appropriate insight and judgment.  ____________________________________________   EKG I, Lisa Roca, MD, the attending physician have personally viewed and interpreted all ECGs.  None ____________________________________________  LABS (pertinent positives/negatives)  Comprehensive metabolic panel within normal limits CBC shows a white blood count 9.2 with left shift. Hemoglobin 14.8 and platelet count 188 Urinalysis negative  ____________________________________________  RADIOLOGY All Xrays were viewed by me. Imaging interpreted by Radiologist.  None __________________________________________  PROCEDURES  Procedure(s) performed: INCISION AND DRAINAGE Performed by: Lisa Roca Consent: Verbal consent obtained. Risks and benefits: risks, benefits and alternatives were discussed Type: abscess  Body area: Left groin Anesthesia: local infiltration  Incision was made with a scalpel.  Local anesthetic: lidocaine 1% with epinephrine  Anesthetic total: 5 ml  Complexity: complex Blunt  dissection  Drainage: none  Drainage amount: none   Patient tolerance: Patient tolerated the procedure well with no immediate complications.    Critical Care performed: None  ____________________________________________   ED COURSE / ASSESSMENT AND PLAN  Pertinent labs & imaging results that were available during my care of the patient were reviewed by me and considered in my medical decision making (see chart for details).   Patient is overall well-appearing but he does have multiple early abscess, with one larger abscess of the groin/inner left thigh which does need I&D.  No evidence for Fournier's gangrene.   Laboratory studies are reassuring.  When I went back to reevaluate the patient getting ready for the I&D, he now had cellulitis extending from his groin all the way to his knee, a large amount, and I feel he needs IV antibiotics and observation admission.  IV clindamycin given.  I&D attempted at left groin area, and no purulent drainage came out, just blood. I also nicked the top of the finger swelling, and again no frank pus.    CONSULTATIONS:  Hospitalist for admission.  Patient / Family / Caregiver informed  of clinical course, medical decision-making process, and agree with plan.    ___________________________________________   FINAL CLINICAL IMPRESSION(S) / ED DIAGNOSES   Final diagnoses:  Cellulitis of left thigh  Boil              Note: This dictation was prepared with Dragon dictation. Any transcriptional errors that result from this process are unintentional   Lisa Roca, MD 02/05/15 [phone]

## 2015-02-05 NOTE — ED Notes (Signed)
MD Sparks at bedside.

## 2015-02-05 NOTE — ED Notes (Signed)
MD at bedside draining groin abscess

## 2015-02-06 LAB — COMPREHENSIVE METABOLIC PANEL
ALK PHOS: 79 U/L (ref 38–126)
ALT: 15 U/L — AB (ref 17–63)
ANION GAP: 7 (ref 5–15)
AST: 17 U/L (ref 15–41)
Albumin: 3.5 g/dL (ref 3.5–5.0)
BILIRUBIN TOTAL: 0.6 mg/dL (ref 0.3–1.2)
BUN: 9 mg/dL (ref 6–20)
CALCIUM: 8.5 mg/dL — AB (ref 8.9–10.3)
CO2: 26 mmol/L (ref 22–32)
CREATININE: 0.87 mg/dL (ref 0.61–1.24)
Chloride: 106 mmol/L (ref 101–111)
GFR calc non Af Amer: >60 mL/min (ref >60)
GLUCOSE: 113 mg/dL — AB (ref 65–99)
Potassium: 3.6 mmol/L (ref 3.5–5.1)
Sodium: 139 mmol/L (ref 135–145)
TOTAL PROTEIN: 6.9 g/dL (ref 6.5–8.1)

## 2015-02-06 LAB — CBC
HEMATOCRIT: 39.1 % — AB (ref 40.0–52.0)
HEMOGLOBIN: 13.7 g/dL (ref 13.0–18.0)
MCH: 31.1 pg (ref 26.0–34.0)
MCHC: 35 g/dL (ref 32.0–36.0)
MCV: 88.9 fL (ref 80.0–100.0)
Platelets: 187 10*3/uL (ref 150–440)
RBC: 4.41 MIL/uL (ref 4.40–5.90)
RDW: 12.5 % (ref 11.5–14.5)
WBC: 7.4 10*3/uL (ref 3.8–10.6)

## 2015-02-06 LAB — RAPID HIV SCREEN (HIV 1/2 AB+AG)
HIV 1/2 ANTIBODIES: NONREACTIVE
HIV-1 P24 Antigen - HIV24: NONREACTIVE

## 2015-02-06 MED ORDER — LIDOCAINE HCL (PF) 1 % IJ SOLN
30.0000 mL | Freq: Once | INTRAMUSCULAR | Status: DC
  Filled 2015-02-06: qty 30

## 2015-02-06 NOTE — Consult Note (Signed)
Orthopedic Consultation Note    Chief Complaint  Patient presents with  . Abscess  . Emesis  . Headache   HPI: Brian Lutz is a 40 year old male admitted to the medicine service yesterday after having I&D performed of a groin abscess.  He had a lower leg abscess drained this past summer and since then has had multiple episodes of abscesses coming and going all over his body.  They have tested positive for MRSA in the past.  He has been on Vancomycin and Clindamycin since admission.  He has had a swelling/pain in his left ring finger over his proximal phalynx.  This is continuing to be quite painful for him.   Past Medical History  Diagnosis Date  . Asthma     Past Surgical History  Procedure Laterality Date  . Cholecystectomy    . Incision and drainage abscess Left 07/26/2014    Procedure: INCISION AND DRAINAGE LEFT LEG ABSCESS;  Surgeon: Marlyce Huge, MD;  Location: [facility] ORS;  Service: General;  Laterality: Left;    Family History  Problem Relation Age of Onset  . Hypertension Mother   . Asthma Brother     Social History  Substance Use Topics  . Smoking status: Former Research scientist (life sciences)  . Smokeless tobacco: Never Used  . Alcohol Use: No    Allergies: No Known Allergies  Prescriptions prior to admission  Medication Sig Dispense Refill Last Dose  . oxyCODONE (OXY IR/ROXICODONE) 5 MG immediate release tablet Take 1 tablet (5 mg total) by mouth every 4 (four) hours as needed for moderate pain. (Patient not taking: Reported on 02/05/2015) 30 tablet 0 Not Taking at Unknown time  . Rivaroxaban (XARELTO STARTER PACK) 15 & 20 MG TBPK Take as directed on package: Start with one 15mg  tablet by mouth twice a day with food. On Day 22, switch to one 20mg  tablet once a day with food. (Patient not taking: Reported on 02/05/2015) 51 each 0 Not Taking at Unknown time  . sulfamethoxazole-trimethoprim (BACTRIM DS,SEPTRA DS) 800-160 MG per tablet Take 1 tablet by mouth every 12 (twelve) hours. (Patient not  taking: Reported on 02/05/2015) 20 tablet 0 Not Taking at Unknown time    ROS Physical Exam Blood pressure 92/53, pulse 81, temperature 98.1 F (36.7 C), temperature source Oral, resp. rate 18, height 6\' 1"  (1.854 m), weight 81.693 kg (180 lb 1.6 oz), SpO2 98 %. Physical Exam  He is alert, oriented, NAD Exam of the left hand reveals an obvious area of swelling/erythema overlying the proximal dorsal aspect of the ring finger.  There is a small black eschar in the middle where previous I&D was attempted.  Distally the finger is warm, well perfused with brisk capillary refill.  Sensation is intact to light touch in the ulnar and radial aspect of the distal finger.  Movement of the MCP and PIP joints is without significant pain.  No other areas of erythema or fluctuance throughout either hand.   Procedures:  Bedside I&D: A digital block was performed using approximately 8cc of 1% Lidocaine without epinepherine.  After appropriate anesthesia was achieved the finger was prepped with betadine solution.  A 1.5 cm incision was made directly overlying the area of maximum fluctuance.  A sterile hemostat was used to spread deep within the wound which expressed about 2cc of purulent fluid. A would culture was sent. Afterwards the wound was irrigated with normal saline, packed with iodophore dressing, and dressed with 4x4s.  A/P: 40 year old male with multiple recurrent  abscesses and left ring finger abscess s/p bedside I&D. -Pain control -Remove dressing and iodophore on Monday and initiate TID warm soapy soaks -Continue antibiotics covering for MRSA -Will continue to monitor while in house

## 2015-02-06 NOTE — Progress Notes (Signed)
[facility] at [place] NAME: Brian Lutz    MR#:  [id number]  DATE OF BIRTH:  12-31-1975  SUBJECTIVE:   Patient wants to know why continues have recurrence of cellulitis.  REVIEW OF SYSTEMS:    Review of Systems  Constitutional: Negative for fever, chills and malaise/fatigue.  HENT: Negative for sore throat.   Eyes: Negative for blurred vision.  Respiratory: Negative for cough, hemoptysis, shortness of breath and wheezing.   Cardiovascular: Negative for chest pain, palpitations and leg swelling.  Gastrointestinal: Negative for nausea, vomiting, abdominal pain, diarrhea and blood in stool.  Genitourinary: Negative for dysuria.  Musculoskeletal: Negative for back pain.  Skin:       Left hand 2nd finger finger has a small cellulitis/possible abscess  groin abscess improved there is no erythema where the areas marked.  Neurological: Negative for dizziness, tremors and headaches.  Endo/Heme/Allergies: Does not bruise/bleed easily.    Tolerating Diet:yes      DRUG ALLERGIES:  No Known Allergies  VITALS:  Blood pressure 92/53, pulse 81, temperature 98.1 F (36.7 C), temperature source Oral, resp. rate 18, height 6\' 1"  (1.854 m), weight 81.693 kg (180 lb 1.6 oz), SpO2 98 %.  PHYSICAL EXAMINATION:   Physical Exam  Constitutional: He is oriented to person, place, and time and well-developed, well-nourished, and in no distress. No distress.  HENT:  Head: Normocephalic.  Eyes: No scleral icterus.  Neck: Normal range of motion. Neck supple. No JVD present. No tracheal deviation present.  Cardiovascular: Normal rate, regular rhythm and normal heart sounds.  Exam reveals no gallop and no friction rub.   No murmur heard. Pulmonary/Chest: Effort normal and breath sounds normal. No respiratory distress. He has no wheezes. He has no rales. He exhibits no tenderness.  Abdominal: Soft. Bowel sounds are normal. He exhibits no distension  and no mass. There is no tenderness. There is no rebound and no guarding.  Musculoskeletal: Normal range of motion. He exhibits no edema.  Neurological: He is alert and oriented to person, place, and time.  Skin: Skin is warm. No erythema.  Left hand 2nd finger finger has a small cellulitis/possible abscess  groin abscess improved there is no erythema where the areas marked.   Psychiatric: Affect and judgment normal.      LABORATORY PANEL:   CBC  Recent Labs Lab 02/06/15 0415  WBC 7.4  HGB 13.7  HCT 39.1*  PLT 187   ------------------------------------------------------------------------------------------------------------------  Chemistries   Recent Labs Lab 02/06/15 0415  NA 139  K 3.6  CL 106  CO2 26  GLUCOSE 113*  BUN 9  CREATININE 0.87  CALCIUM 8.5*  AST 17  ALT 15*  ALKPHOS 79  BILITOT 0.6   ------------------------------------------------------------------------------------------------------------------  Cardiac Enzymes No results for input(s): TROPONINI in the last 168 hours. ------------------------------------------------------------------------------------------------------------------  RADIOLOGY:  No results found.   ASSESSMENT AND PLAN:   40 year old male with past medical history significant or left lower extremity DVT and cellulitis who presented with left groin cellulitis.  1. Groin abscess and cellulitis: This is I indeed in the emergency room. Wound cultures are pending. Cellulitis is improved. Continue clindamycin. ID consult as patient is curious as to why he continues to have recurrence of cellulitis. Check HIV status.  2. Finger abscess/cellulitis: Ortho Evra evaluation for possible I&D. No obvious area of fluctuation. This was attempted in the emergency room but was unsuccessful.    Management plans discussed with the patient and he is in  agreement.  CODE STATUS: FULL  TOTAL TIME TAKING CARE OF THIS PATIENT: 33 minutes.      POSSIBLE D/C 1-2 days, DEPENDING ON CLINICAL CONDITION.   Ellon Marasco M.D on 02/06/2015 at 10:54 AM  Between 7am to 6pm - Pager - [phone] After 6pm go to [email or web address] - password [email or web address] [facility]  [facility]  Office  [phone]  CC: Primary care physician; No PCP Per Patient  Note: This dictation was prepared with Dragon dictation along with smaller phrase technology. Any transcriptional errors that result from this process are unintentional.

## 2015-02-07 LAB — GLUCOSE, CAPILLARY: Glucose-Capillary: 123 mg/dL — ABNORMAL HIGH (ref 65–99)

## 2015-02-07 NOTE — Progress Notes (Signed)
[facility] at [place] NAME: Brian Lutz    MR#:  [id number]  DATE OF BIRTH:  [birth year]  SUBJECTIVE:   Patient wants to know why continues have recurrence of cellulitis.  REVIEW OF SYSTEMS:    Review of Systems  Constitutional: Negative for fever, chills and malaise/fatigue.  HENT: Negative for sore throat.   Eyes: Negative for blurred vision.  Respiratory: Negative for cough, hemoptysis, shortness of breath and wheezing.   Cardiovascular: Negative for chest pain, palpitations and leg swelling.  Gastrointestinal: Negative for nausea, vomiting, abdominal pain, diarrhea and blood in stool.  Genitourinary: Negative for dysuria.  Musculoskeletal: Negative for back pain.  Skin:       Cellulitis improved.  Neurological: Negative for dizziness, tremors and headaches.  Endo/Heme/Allergies: Does not bruise/bleed easily.    Tolerating Diet:yes      DRUG ALLERGIES:  No Known Allergies  VITALS:  Blood pressure 107/66, pulse 60, temperature 98.4 F (36.9 C), temperature source Oral, resp. rate 18, height 6\' 1"  (1.854 m), weight 82.192 kg (181 lb 3.2 oz), SpO2 99 %.  PHYSICAL EXAMINATION:   Physical Exam  Constitutional: He is oriented to person, place, and time and well-developed, well-nourished, and in no distress. No distress.  HENT:  Head: Normocephalic.  Eyes: No scleral icterus.  Neck: Normal range of motion. Neck supple. No JVD present. No tracheal deviation present.  Cardiovascular: Normal rate, regular rhythm and normal heart sounds.  Exam reveals no gallop and no friction rub.   No murmur heard. Pulmonary/Chest: Effort normal and breath sounds normal. No respiratory distress. He has no wheezes. He has no rales. He exhibits no tenderness.  Abdominal: Soft. Bowel sounds are normal. He exhibits no distension and no mass. There is no tenderness. There is no rebound and no guarding.  Musculoskeletal: Normal range of  motion. He exhibits no edema.  Neurological: He is alert and oriented to person, place, and time.  Skin: Skin is warm. No erythema.  Left hand wrapped. Cellulitis improved groin   Psychiatric: Affect and judgment normal.      LABORATORY PANEL:   CBC  Recent Labs Lab 02/06/15 0415  WBC 7.4  HGB 13.7  HCT 39.1*  PLT 187   ------------------------------------------------------------------------------------------------------------------  Chemistries   Recent Labs Lab 02/06/15 0415  NA 139  K 3.6  CL 106  CO2 26  GLUCOSE 113*  BUN 9  CREATININE 0.87  CALCIUM 8.5*  AST 17  ALT 15*  ALKPHOS 79  BILITOT 0.6   ------------------------------------------------------------------------------------------------------------------  Cardiac Enzymes No results for input(s): TROPONINI in the last 168 hours. ------------------------------------------------------------------------------------------------------------------  RADIOLOGY:  No results found.   ASSESSMENT AND PLAN:   40 year old male with past medical history significant or left lower extremity DVT and cellulitis who presented with left groin cellulitis.  1. Groin abscess and cellulitis: This was i and d in the emergency room. Wound cultures growing staph. Continue clindamycin.  ID consult as patient is curious as to why he continues to have recurrence of cellulitis.  HIV nonreactive Hemoglobin A1c less than 6%.  2. Finger abscess/cellulitis: Status post incision and drainage by orthopedics. Patient reports less pain.  Management plans discussed with the patient and he is in agreement.  CODE STATUS: FULL  TOTAL TIME TAKING CARE OF THIS PATIENT: 25 minutes.     POSSIBLE D/C tomorrow DEPENDING ON CLINICAL CONDITION.   Shamicka Inga M.D on 02/07/2015 at 10:47 AM  Between 7am to 6pm - Pager -  [phone] After 6pm go to [email or web address] - password [email or web address] [facility]  [facility]  Office   [phone]  CC: Primary care physician; No PCP Per Patient  Note: This dictation was prepared with Dragon dictation along with smaller phrase technology. Any transcriptional errors that result from this process are unintentional.

## 2015-02-07 NOTE — Consult Note (Signed)
[facility] Infectious Disease     Reason for Consult: Abscess    Referring Physician: Bettey Costa Date of Admission:  02/05/2015   Principal Problem:   Cellulitis of left leg Active Problems:   Abscess of groin, left   Nausea   Hx MRSA infection   Cellulitis   HPI: Brian Lutz is a 40 y.o. male has a past medical history significant for LLE DVT with hx of prior MRSA cellulitis admitted with  3-4 day hx of pain in left groin with abscess formation and redness spreading. In ER  was found to have multiple abscesses with the largest one in the left groin. In the ER, the erythema of the left thigh was extending rapidly. I and D was performed by the ER MD and he was started on IV ABX. Clinically the redness is improving. He reports 4 prior episodes. Treated with abx but then recur.  His main complaint today however is chronic abd pain.  He reports wu in past with cholecystectomy done but it did not relieve his sxs.    Past Medical History  Diagnosis Date  . Asthma    Past Surgical History  Procedure Laterality Date  . Cholecystectomy    . Incision and drainage abscess Left 07/26/2014    Procedure: INCISION AND DRAINAGE LEFT LEG ABSCESS;  Surgeon: Marlyce Huge, MD;  Location: [facility] ORS;  Service: General;  Laterality: Left;   Social History  Substance Use Topics  . Smoking status: Former Research scientist (life sciences)  . Smokeless tobacco: Never Used  . Alcohol Use: No   Family History  Problem Relation Age of Onset  . Hypertension Mother   . Asthma Brother     Allergies: No Known Allergies  Current antibiotics: Antibiotics Given (last 72 hours)    Date/Time Action Medication Dose Rate   02/05/15 2145 Given   clindamycin (CLEOCIN) IVPB 600 mg 600 mg 100 mL/hr   02/06/15 0616 Given   clindamycin (CLEOCIN) IVPB 600 mg 600 mg 100 mL/hr   02/06/15 1339 Given   clindamycin (CLEOCIN) IVPB 600 mg 600 mg 100 mL/hr   02/06/15 2140 Given   clindamycin (CLEOCIN) IVPB 600 mg 600 mg 100 mL/hr   02/07/15 0539 Given   clindamycin (CLEOCIN) IVPB 600 mg 600 mg 100 mL/hr   02/07/15 1400 Given   clindamycin (CLEOCIN) IVPB 600 mg 600 mg 100 mL/hr      MEDICATIONS: . aspirin EC  81 mg Oral Daily  . clindamycin (CLEOCIN) IV  600 mg Intravenous 3 times per day  . docusate sodium  100 mg Oral BID  . heparin  5,000 Units Subcutaneous 3 times per day  . lidocaine (PF)  30 mL Other Once  . pantoprazole  40 mg Oral Daily    Review of Systems - 11 systems reviewed and negative per HPI   OBJECTIVE: Temp:  [98 F (36.7 C)-98.4 F (36.9 C)] 98.4 F (36.9 C) (02/06 0557) Pulse Rate:  [60-76] 60 (02/06 0557) Resp:  [18-20] 18 (02/06 0557) BP: (107-109)/(65-66) 107/66 mmHg (02/06 0557) SpO2:  [97 %-99 %] 99 % (02/06 0557) Weight:  [82.192 kg (181 lb 3.2 oz)] 82.192 kg (181 lb 3.2 oz) (02/06 0500) Physical Exam  Constitutional: He is oriented to person, place, and time. He appears well-developed and well-nourished. No distress.  HENT: anicterc Mouth/Throat: Oropharynx is clear and dry . No oropharyngeal exudate.  Cardiovascular: Normal rate, regular rhythm and normal heart sounds. Pulmonary/Chest: Effort normal and breath sounds normal. No respiratory distress. He  has no wheezes.  Abdominal: Soft. Bowel sounds are normal. He exhibits no distension. Lymphadenopathy:  -He has no cervical adenopathy.  Neurological: He is alert and oriented to person, place, and time.  Skin:L hand wrapped after I and D, L groin site with receding redness Psychiatric: He has a normal mood and affect. His behavior is normal.    LABS: Results for orders placed or performed during the hospital encounter of 02/05/15 (from the past 48 hour(s))  Wound culture     Status: None (Preliminary result)   Collection Time: 02/05/15  4:19 PM  Result Value Ref Range   Specimen Description ABSCESS    Special Requests Normal    Gram Stain      MANY RED BLOOD CELLS FEW WBC SEEN NO ORGANISMS SEEN    Culture HOLDING  FOR POSSIBLE PATHOGEN    Report Status PENDING   CBC     Status: Abnormal   Collection Time: 02/06/15  4:15 AM  Result Value Ref Range   WBC 7.4 3.8 - 10.6 K/uL   RBC 4.41 4.40 - 5.90 MIL/uL   Hemoglobin 13.7 13.0 - 18.0 g/dL   HCT 39.1 (L) 40.0 - 52.0 %   MCV 88.9 80.0 - 100.0 fL   MCH 31.1 26.0 - 34.0 pg   MCHC 35.0 32.0 - 36.0 g/dL   RDW 12.5 11.5 - 14.5 %   Platelets 187 150 - 440 K/uL  Comprehensive metabolic panel     Status: Abnormal   Collection Time: 02/06/15  4:15 AM  Result Value Ref Range   Sodium 139 135 - 145 mmol/L   Potassium 3.6 3.5 - 5.1 mmol/L   Chloride 106 101 - 111 mmol/L   CO2 26 22 - 32 mmol/L   Glucose, Bld 113 (H) 65 - 99 mg/dL   BUN 9 6 - 20 mg/dL   Creatinine, Ser 0.87 0.61 - 1.24 mg/dL   Calcium 8.5 (L) 8.9 - 10.3 mg/dL   Total Protein 6.9 6.5 - 8.1 g/dL   Albumin 3.5 3.5 - 5.0 g/dL   AST 17 15 - 41 U/L   ALT 15 (L) 17 - 63 U/L   Alkaline Phosphatase 79 38 - 126 U/L   Total Bilirubin 0.6 0.3 - 1.2 mg/dL   GFR calc non Af Amer >60 >60 mL/min   GFR calc Af Amer >60 >60 mL/min    Comment: (NOTE) The eGFR has been calculated using the CKD EPI equation. This calculation has not been validated in all clinical situations. eGFR's persistently <60 mL/min signify possible Chronic Kidney Disease.    Anion gap 7 5 - 15  Rapid HIV screen (HIV 1/2 Ab+Ag)     Status: None   Collection Time: 02/06/15  4:15 AM  Result Value Ref Range   HIV-1 P24 Antigen - HIV24 NON REACTIVE NON REACTIVE   HIV 1/2 Antibodies NON REACTIVE NON REACTIVE   Interpretation (HIV Ag Ab)      A non reactive test result means that HIV 1 or HIV 2 antibodies and HIV 1 p24 antigen were not detected in the specimen.  Wound culture     Status: None (Preliminary result)   Collection Time: 02/06/15  2:51 PM  Result Value Ref Range   Specimen Description ABSCESS    Special Requests Normal    Gram Stain      MODERATE RED BLOOD CELLS FEW WBC SEEN NO ORGANISMS SEEN    Culture       HEAVY GROWTH STAPHYLOCOCCUS AUREUS  SUSCEPTIBILITIES TO FOLLOW    Report Status PENDING   Glucose, capillary     Status: Abnormal   Collection Time: 02/07/15  8:04 AM  Result Value Ref Range   Glucose-Capillary 123 (H) 65 - 99 mg/dL   No components found for: ESR, C REACTIVE PROTEIN MICRO: Recent Results (from the past 720 hour(s))  Wound culture     Status: None (Preliminary result)   Collection Time: 02/05/15  4:19 PM  Result Value Ref Range Status   Specimen Description ABSCESS  Final   Special Requests Normal  Final   Gram Stain   Final    MANY RED BLOOD CELLS FEW WBC SEEN NO ORGANISMS SEEN    Culture HOLDING FOR POSSIBLE PATHOGEN  Final   Report Status PENDING  Incomplete  Wound culture     Status: None (Preliminary result)   Collection Time: 02/06/15  2:51 PM  Result Value Ref Range Status   Specimen Description ABSCESS  Final   Special Requests Normal  Final   Gram Stain   Final    MODERATE RED BLOOD CELLS FEW WBC SEEN NO ORGANISMS SEEN    Culture   Final    HEAVY GROWTH STAPHYLOCOCCUS AUREUS SUSCEPTIBILITIES TO FOLLOW    Report Status PENDING  Incomplete    IMAGING: No results found.  Assessment:   Brian Lutz is a 40 y.o. 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 He is HIV negative.  Also complains of recurrent abd pain separate from current abscesses.    Recommendations Cont clinda pending sensis results which should be back tomorrow.  Will rec hibiclens and bactroban at dc to decrease colonizaiton with MRSA and try to prevent recurrences  Thank you very much for allowing me to participate in the care of this patient. Please call with questions.   Cheral Marker. Ola Spurr, MD

## 2015-02-07 NOTE — Care Management Note (Signed)
Case Management Note  Patient Details  Name: ATIKSH GANDEE MRN: [id number] Date of Birth: Jul 10, 1975  Subjective/Objective:                 Patient admitted with multiple abscesses and cellulitis.  Patient states that he is employed, however he does not have health insurance.  Patient states that he has been on outpatient antibiotics that were $100, but he was able to pay for them and took them as prescribed.  Patient is currently living with his sister.   Action/Plan: Provided [facility] with [facility], and medication management.  RNCM to follow for discharge planning related to medication needs  Expected Discharge Date:  02/07/15               Expected Discharge Plan:     In-House Referral:     Discharge planning Services     Post Acute Care Choice:    Choice offered to:     DME Arranged:    DME Agency:     HH Arranged:    [place] Agency:     Status of Service:     Medicare Important Message Given:    Date Medicare IM Given:    Medicare IM give by:    Date Additional Medicare IM Given:    Additional Medicare Important Message give by:     If discussed at [place] of Stay Meetings, dates discussed:    Additional Comments:  Beverly Sessions, RN 02/07/2015, 3:36 PM

## 2015-02-08 LAB — HSV(HERPES SIMPLEX VRS) I + II AB-IGG
HSV 1 Glycoprotein G Ab, IgG: 18.8 index — ABNORMAL HIGH (ref 0.00–0.90)
HSV 2 GLYCOPROTEIN G AB, IGG: 14.7 {index} — AB (ref 0.00–0.90)

## 2015-02-08 LAB — HEPATITIS C ANTIBODY: HCV Ab: <0.1 s/co ratio (ref 0.0–0.9)

## 2015-02-08 MED ORDER — CHLORHEXIDINE GLUCONATE 4 % EX SOLN: 1.0000 "application " | Freq: Every day | CUTANEOUS | Status: DC

## 2015-02-08 MED ORDER — MUPIROCIN CALCIUM 2 % NA OINT: 1.0000 "application " | TOPICAL_OINTMENT | Freq: Two times a day (BID) | NASAL | Status: DC

## 2015-02-08 MED ORDER — PANTOPRAZOLE SODIUM 40 MG PO TBEC: 40.0000 mg | DELAYED_RELEASE_TABLET | Freq: Every day | ORAL | Status: DC

## 2015-02-08 MED ORDER — CLINDAMYCIN HCL 300 MG PO CAPS: 300.0000 mg | ORAL_CAPSULE | Freq: Three times a day (TID) | ORAL | Status: DC

## 2015-02-08 NOTE — Discharge Summary (Signed)
[place] at [place] NAME: Brian Lutz    MR#:  [id number]  DATE OF BIRTH:  Nov 03, 1975  DATE OF ADMISSION:  02/05/2015 ADMITTING PHYSICIAN: Idelle Crouch, MD  DATE OF DISCHARGE: 02/08/2015 PRIMARY CARE PHYSICIAN: No PCP Per Patient    ADMISSION DIAGNOSIS:  Boil [L02.92] Cellulitis of left thigh [id number]  DISCHARGE DIAGNOSIS:  Principal Problem:   Cellulitis of left leg Active Problems:   Abscess of groin, left   Nausea   Hx MRSA infection   Cellulitis   SECONDARY DIAGNOSIS:   Past Medical History  Diagnosis Date  . Asthma     HOSPITAL COURSE:   40 year old male with a history of MRSA infection who presented with groin abscess and an abscess of finger on left hand. For further details as per H&P.  1. Groin abscess and cellulitis: Cellulitis is much improved with IV clindamycin. Culture is growing out Staphylococcus aureus. Patient was seen and evaluated by infectious disease. HIV testing was negative. Patient will be discharged on clindamycin.  2. Left ring finger abscess: Patient is status post bedside incision and drainage. Dressing and iodophor has been removed. Patient will initiate 3 times a day warm soapy soaks as per orthopedic surgery consultation. Continue clindamycin.  3. History of MRSA: Patient will be discharged with Bactroban and chlorhexidine body wash.  4. Chronic abdominal pain: Patient be discharged on Protonix 40 mg daily. If this is not effective then he will need GI follow-up which will be scheduled in 5 weeks from discharge. He has seen a GI physician in the past. He was diagnosed with irritable bowel syndrome in the past. I am suspecting his symptoms are related to IBS.   DISCHARGE CONDITIONS AND DIET:   Is stable for discharge on a regular diet  CONSULTS OBTAINED:  Treatment Team:  Renae Gloss, MD Dereck Leep, MD Adrian Prows, MD  DRUG ALLERGIES:  No Known  Allergies  DISCHARGE MEDICATIONS:   Current Discharge Medication List    START taking these medications   Details  Chlorhexidine Gluconate 4 % SOLN Apply 1 application topically daily. Qty: 237 mL, Refills: 0    clindamycin (CLEOCIN) 300 MG capsule Take 1 capsule (300 mg total) by mouth 3 (three) times daily. Qty: 30 capsule, Refills: 0    mupirocin nasal ointment (BACTROBAN NASAL) 2 % Place 1 application into the nose 2 (two) times daily. Use one-half of tube in each nostril twice daily for2 weeks After application, press sides of nose together and gently massage. Qty: 10 g, Refills: 0    pantoprazole (PROTONIX) 40 MG tablet Take 1 tablet (40 mg total) by mouth daily. Qty: 30 tablet, Refills: 0      STOP taking these medications     oxyCODONE (OXY IR/ROXICODONE) 5 MG immediate release tablet      Rivaroxaban (XARELTO STARTER PACK) 15 & 20 MG TBPK      sulfamethoxazole-trimethoprim (BACTRIM DS,SEPTRA DS) 800-160 MG per tablet               Today   CHIEF COMPLAINT:  Patient is doing well this morning. Cellulitis improved. Patient has chronic abdominal pain.   VITAL SIGNS:  Blood pressure 110/67, pulse 65, temperature 97.8 F (36.6 C), temperature source Oral, resp. rate 16, height 6\' 1"  (1.854 m), weight 80.559 kg (177 lb 9.6 oz), SpO2 99 %.   REVIEW OF SYSTEMS:  Review of Systems  Constitutional: Negative for fever, chills and malaise/fatigue.  HENT:  Negative for sore throat.   Eyes: Negative for blurred vision.  Respiratory: Negative for cough, hemoptysis, shortness of breath and wheezing.   Cardiovascular: Negative for chest pain, palpitations and leg swelling.  Gastrointestinal: Positive for abdominal pain (chronic). Negative for nausea, vomiting, diarrhea and blood in stool.  Genitourinary: Negative for dysuria.  Musculoskeletal: Negative for back pain.  Skin:       Cellulitis improved  Neurological: Negative for dizziness, tremors and headaches.   Endo/Heme/Allergies: Does not bruise/bleed easily.     PHYSICAL EXAMINATION:  GENERAL:  40 y.o.-year-old patient lying in the bed with no acute distress.  NECK:  Supple, no jugular venous distention. No thyroid enlargement, no tenderness.  LUNGS: Normal breath sounds bilaterally, no wheezing, rales,rhonchi  No use of accessory muscles of respiration.  CARDIOVASCULAR: S1, S2 normal. No murmurs, rubs, or gallops.  ABDOMEN: Soft, non-tender, non-distended. Bowel sounds present. No organomegaly or mass.  EXTREMITIES: No pedal edema, cyanosis, or clubbing.  PSYCHIATRIC: The patient is alert and oriented x 3.  SKIN: No obvious rash, lesion, or ulcer.   DATA REVIEW:   CBC  Recent Labs Lab 02/06/15 0415  WBC 7.4  HGB 13.7  HCT 39.1*  PLT 187    Chemistries   Recent Labs Lab 02/06/15 0415  NA 139  K 3.6  CL 106  CO2 26  GLUCOSE 113*  BUN 9  CREATININE 0.87  CALCIUM 8.5*  AST 17  ALT 15*  ALKPHOS 79  BILITOT 0.6    Cardiac Enzymes No results for input(s): TROPONINI in the last 168 hours.  Microbiology Results  @[email or web address] @  RADIOLOGY:  No results found.    Management plans discussed with the patient and he is in agreement. Stable for discharge home  Patient should follow up with GI 5 weeks  CODE STATUS:     Code Status Orders        Start     Ordered   02/05/15 1640  Full code   Continuous     02/05/15 1639    Code Status History    Date Active Date Inactive Code Status Order ID Comments User Context   07/25/2014 12:46 AM 07/27/2014  6:37 PM Full Code [id number]  Nicholes Mango, MD Inpatient      TOTAL TIME TAKING CARE OF THIS PATIENT: 35 minutes.    Note: This dictation was prepared with Dragon dictation along with smaller phrase technology. Any transcriptional errors that result from this process are unintentional.  Pius Byrom M.D on 02/08/2015 at 10:34 AM  Between 7am to 6pm - Pager - [phone] After 6pm go to [email or web address] - password  [email or web address] [facility]  [place] [facility]  Office  [phone]  CC: Primary care physician; No PCP Per Patient

## 2015-02-08 NOTE — Care Management (Signed)
Plan for patient to discharge today.  Patient was provided coupons for clindamycin, mupirocin nasal ointment, and protonix. Prices reviewed, patient confirms that he will be able to afford his medication.  Patient has the application for [facility], and Medication management.  Patient states that he will complete them after discharge. RNCM signing off

## 2015-02-08 NOTE — Progress Notes (Signed)
Pt to be discharged per MD order. IV removed. Dressing and packing removed per MD order. 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 Wound is still very red and draining. MD notified and said pt could go back to work tomorrow. Pt hesitant to leave since this is a re occuring issue. Changed applied kerlix dressing for pt. Provided scripts and answered all questions. Pt preferred to walk out with no wheelchair.

## 2015-02-09 LAB — WOUND CULTURE
SPECIAL REQUESTS: NORMAL
Special Requests: NORMAL

## 2015-02-11 LAB — CULTURE, BLOOD (ROUTINE X 2)
Culture: NO GROWTH
Culture: NO GROWTH

## 2015-04-12 ENCOUNTER — Encounter: Payer: Self-pay | Admitting: Medical Oncology

## 2015-04-12 ENCOUNTER — Emergency Department
Admission: EM | Admit: 2015-04-12 | Discharge: 2015-04-12 | Disposition: A | Discharge status: Home / Self Care | Payer: Self-pay | Attending: Emergency Medicine | Admitting: Emergency Medicine

## 2015-04-12 DIAGNOSIS — Z87891 Personal history of nicotine dependence: Secondary | ICD-10-CM | POA: Insufficient documentation

## 2015-04-12 DIAGNOSIS — Z791 Long term (current) use of non-steroidal anti-inflammatories (NSAID): Secondary | ICD-10-CM | POA: Insufficient documentation

## 2015-04-12 DIAGNOSIS — A4902 Methicillin resistant Staphylococcus aureus infection, unspecified site: Secondary | ICD-10-CM | POA: Insufficient documentation

## 2015-04-12 DIAGNOSIS — H00015 Hordeolum externum left lower eyelid: Secondary | ICD-10-CM | POA: Insufficient documentation

## 2015-04-12 DIAGNOSIS — H00016 Hordeolum externum left eye, unspecified eyelid: Secondary | ICD-10-CM

## 2015-04-12 HISTORY — DX: Carrier or suspected carrier of methicillin resistant Staphylococcus aureus: Z22.322

## 2015-04-12 MED ORDER — TETRACAINE HCL 0.5 % OP SOLN
OPHTHALMIC | Status: AC
  Filled 2015-04-12: qty 2

## 2015-04-12 MED ORDER — CLINDAMYCIN HCL 150 MG PO CAPS: 150.0000 mg | ORAL_CAPSULE | Freq: Four times a day (QID) | ORAL | Status: DC

## 2015-04-12 MED ORDER — GENTAMICIN SULFATE 0.3 % OP SOLN: 1.0000 [drp] | Freq: Four times a day (QID) | OPHTHALMIC | Status: DC

## 2015-04-12 MED ORDER — OXYCODONE-ACETAMINOPHEN 5-325 MG PO TABS: 1.0000 | ORAL_TABLET | ORAL | Status: DC | PRN

## 2015-04-12 NOTE — ED Provider Notes (Signed)
[facility] Emergency Department Provider Note  ____________________________________________  Time seen: Approximately 11:28 AM  I have reviewed the triage vital signs and the nursing notes.   HISTORY  Chief Complaint Eye Problem    HPI Brian Lutz is a 40 y.o. male patient complaining of pain and edema to the left lower eyelid for 3 days. Patient denies any injury. Patient state [place] advised by some watering to his eyes. Patient state there is vision loss to the left eye. No palliative measures taken for this complaint. Patient stated this been some mild discharge from the papular lesion on the eyelid. Patient denies any fever associated with this complaint. Patient rates his pain as a 9/10. Patient described a pain as sharp.   Past Medical History  Diagnosis Date  . Asthma   . MRSA (methicillin resistant staph aureus) culture positive     Patient Active Problem List   Diagnosis Date Noted  . Abscess of groin, left 02/05/2015  . Nausea 02/05/2015  . Hx MRSA infection 02/05/2015  . Cellulitis 02/05/2015  . Left leg DVT ([place]) 07/27/2014  . Cellulitis of left leg 07/24/2014    Past Surgical History  Procedure Laterality Date  . Cholecystectomy    . Incision and drainage abscess Left 07/26/2014    Procedure: INCISION AND DRAINAGE LEFT LEG ABSCESS;  Surgeon: Marlyce Huge, MD;  Location: [facility] ORS;  Service: General;  Laterality: Left;    Current Outpatient Rx  Name  Route  Sig  Dispense  Refill  . pantoprazole (PROTONIX) 40 MG tablet   Oral   Take 1 tablet (40 mg total) by mouth daily.   30 tablet   0   . Chlorhexidine Gluconate 4 % SOLN   Apply externally   Apply 1 application topically daily.   237 mL   0     4% body wash for 2 weeks daily   . clindamycin (CLEOCIN) 150 MG capsule   Oral   Take 1 capsule (150 mg total) by mouth 4 (four) times daily.   40 capsule   0   . clindamycin (CLEOCIN) 300 MG  capsule   Oral   Take 1 capsule (300 mg total) by mouth 3 (three) times daily.   30 capsule   0   . gentamicin (GARAMYCIN) 0.3 % ophthalmic solution   Left Eye   Place 1 drop into the left eye 4 (four) times daily.   5 mL   0   . mupirocin nasal ointment (BACTROBAN NASAL) 2 %   Nasal   Place 1 application into the nose 2 (two) times daily. Use one-half of tube in each nostril twice daily for2 weeks After application, press sides of nose together and gently massage.   10 g   0   . oxyCODONE-acetaminophen (ROXICET) 5-325 MG tablet   Oral   Take 1 tablet by mouth every 4 (four) hours as needed for severe pain.   30 tablet   0     Allergies Review of patient's allergies indicates no known allergies.  Family History  Problem Relation Age of Onset  . Hypertension Mother   . Asthma Brother     Social History Social History  Substance Use Topics  . Smoking status: Former Research scientist (life sciences)  . Smokeless tobacco: Never Used  . Alcohol Use: No    Review of Systems Constitutional: No fever/chills Eyes: Patient states decreased vision left eye. Secondary to pain of the left  lower eyelid. ENT: No sore throat. Cardiovascular: Denies chest pain. Respiratory: Denies shortness of breath. Gastrointestinal: No abdominal pain.  No nausea, no vomiting.  No diarrhea.  No constipation. Genitourinary: Negative for dysuria. Musculoskeletal: Negative for back pain. Skin: Negative for rash. Neurological: Negative for headaches, focal weakness or numbness. 10-point ROS otherwise negative.  ____________________________________________   PHYSICAL EXAM:  VITAL SIGNS: ED Triage Vitals  Enc Vitals Group     BP 04/12/15 1127 116/73 mmHg     Pulse Rate 04/12/15 1127 93     Resp 04/12/15 1127 20     Temp 04/12/15 1127 97.6 F (36.4 C)     Temp Source 04/12/15 1127 Oral     SpO2 04/12/15 1127 98 %     Weight 04/12/15 1127 195 lb (88.451 kg)     Height 04/12/15 1127 6\' 1"  (1.854 m)     Head Cir  --      Peak Flow --      Pain Score 04/12/15 1114 9     Pain Loc --      Pain Edu? --      Excl. in [place]? --     Constitutional: Alert and oriented. Well appearing and in no acute distress. Eyes: Conjunctivae are normal. PERRL. EOMI.Papular lesion nasal aspect of the lower eyelid. Head: Atraumatic. Nose: No congestion/rhinnorhea. Mouth/Throat: Mucous membranes are moist.  Oropharynx non-erythematous. Neck: No stridor.  No cervical spine tenderness to palpation. Hematological/Lymphatic/Immunilogical: No cervical lymphadenopathy. Cardiovascular: Normal rate, regular rhythm. Grossly normal heart sounds.  Good peripheral circulation. Respiratory: Normal respiratory effort.  No retractions. Lungs CTAB. Gastrointestinal: Soft and nontender. No distention. No abdominal bruits. No CVA tenderness. Musculoskeletal: No lower extremity tenderness nor edema.  No joint effusions. Neurologic:  Normal speech and language. No gross focal neurologic deficits are appreciated. No gait instability. Skin:  Skin is warm, dry and intact. No rash noted. Psychiatric: Mood and affect are normal. Speech and behavior are normal.  ____________________________________________   LABS (all labs ordered are listed, but only abnormal results are displayed)  Labs Reviewed - No data to display ____________________________________________  EKG   ____________________________________________  RADIOLOGY   ____________________________________________   PROCEDURES  Procedure(s) performed: None  Critical Care performed: No  ____________________________________________   INITIAL IMPRESSION / ASSESSMENT AND PLAN / ED COURSE  Pertinent labs & imaging results that were available during my care of the patient were reviewed by me and considered in my medical decision making (see chart for details).  Hordeolum left lower eyelid. Patient has associated lower inferior cellulitis. Patient given discharge care  instructions. Patient given a prescription for Garamycin eyedrops, clindamycin, and Percocets. Patient advised follow-up with [facility] if no improvement or worsening of his complaint in 2 days.   FINAL CLINICAL IMPRESSION(S) / ED DIAGNOSES  Final diagnoses:  Hordeolum external, left      Sable Feil, PA-C 04/12/15 [place], MD 04/12/15 1539

## 2015-04-12 NOTE — Discharge Instructions (Signed)
Stye A stye is a bump on your eyelid caused by a bacterial infection. A stye can form inside the eyelid (internal stye) or outside the eyelid (external stye). An internal stye may be caused by an infected oil-producing gland inside your eyelid. An external stye may be caused by an infection at the base of your eyelash (hair follicle). Styes are very common. Anyone can get them at any age. They usually occur in just one eye, but you may have more than one in either eye.  CAUSES  The infection is almost always caused by bacteria called Staphylococcus aureus. This is a common type of bacteria that lives on your skin. RISK FACTORS You may be at higher risk for a stye if you have had one before. You may also be at higher risk if you have:  Diabetes.  Long-term illness.  Long-term eye redness.  A skin condition called seborrhea.  High fat levels in your blood (lipids). SIGNS AND SYMPTOMS  Eyelid pain is the most common symptom of a stye. Internal styes are more painful than external styes. Other signs and symptoms may include:  Painful swelling of your eyelid.  A scratchy feeling in your eye.  Tearing and redness of your eye.  Pus draining from the stye. DIAGNOSIS  Your health care provider may be able to diagnose a stye just by examining your eye. The health care provider may also check to make sure:  You do not have a fever or other signs of a more serious infection.  The infection has not spread to other parts of your eye or areas around your eye. TREATMENT  Most styes will clear up in a few days without treatment. In some cases, you may need to use antibiotic drops or ointment to prevent infection. Your health care provider may have to drain the stye surgically if your stye is:  Large.  Causing a lot of pain.  Interfering with your vision. This can be done using a thin blade or a needle.  HOME CARE INSTRUCTIONS   Take medicines only as directed by your health care  provider.  Apply a clean, warm compress to your eye for 10 minutes, 4 times a day.  Do not wear contact lenses or eye makeup until your stye has healed.  Do not try to pop or drain the stye. SEEK MEDICAL CARE IF:  You have chills or a fever.  Your stye does not go away after several days.  Your stye affects your vision.  Your eyeball becomes swollen, red, or painful. MAKE SURE YOU:  Understand these instructions.  Will watch your condition.  Will get help right away if you are not doing well or get worse.   This information is not intended to replace advice given to you by your health care provider. Make sure you discuss any questions you have with your health care provider.   Document Released: 09/27/2004 Document Revised: 01/08/2014 Document Reviewed: 04/03/2013 Elsevier Interactive Patient Education 2016 [employer]  

## 2015-04-12 NOTE — ED Notes (Signed)
Left eye redness/pain x 3 days. Denies injury.

## 2015-06-30 ENCOUNTER — Emergency Department: Payer: Self-pay

## 2015-06-30 ENCOUNTER — Encounter: Payer: Self-pay | Admitting: Emergency Medicine

## 2015-06-30 ENCOUNTER — Emergency Department
Admission: EM | Admit: 2015-06-30 | Discharge: 2015-06-30 | Disposition: A | Discharge status: Home / Self Care | Payer: Self-pay | Attending: Emergency Medicine | Admitting: Emergency Medicine

## 2015-06-30 DIAGNOSIS — R1110 Vomiting, unspecified: Secondary | ICD-10-CM

## 2015-06-30 DIAGNOSIS — B3789 Other sites of candidiasis: Secondary | ICD-10-CM | POA: Insufficient documentation

## 2015-06-30 DIAGNOSIS — Z87891 Personal history of nicotine dependence: Secondary | ICD-10-CM | POA: Insufficient documentation

## 2015-06-30 DIAGNOSIS — J45909 Unspecified asthma, uncomplicated: Secondary | ICD-10-CM | POA: Insufficient documentation

## 2015-06-30 DIAGNOSIS — R739 Hyperglycemia, unspecified: Secondary | ICD-10-CM | POA: Insufficient documentation

## 2015-06-30 DIAGNOSIS — R109 Unspecified abdominal pain: Secondary | ICD-10-CM

## 2015-06-30 DIAGNOSIS — B3749 Other urogenital candidiasis: Secondary | ICD-10-CM

## 2015-06-30 DIAGNOSIS — R197 Diarrhea, unspecified: Secondary | ICD-10-CM

## 2015-06-30 LAB — COMPREHENSIVE METABOLIC PANEL
ALK PHOS: 106 U/L (ref 38–126)
ALT: 43 U/L (ref 17–63)
ANION GAP: 12 (ref 5–15)
AST: 36 U/L (ref 15–41)
Albumin: 5.1 g/dL — ABNORMAL HIGH (ref 3.5–5.0)
BILIRUBIN TOTAL: 0.8 mg/dL (ref 0.3–1.2)
BUN: 9 mg/dL (ref 6–20)
CALCIUM: 10 mg/dL (ref 8.9–10.3)
CO2: 24 mmol/L (ref 22–32)
CREATININE: 0.98 mg/dL (ref 0.61–1.24)
Chloride: 101 mmol/L (ref 101–111)
Glucose, Bld: 135 mg/dL — ABNORMAL HIGH (ref 65–99)
Potassium: 3.7 mmol/L (ref 3.5–5.1)
Sodium: 137 mmol/L (ref 135–145)
TOTAL PROTEIN: 9.2 g/dL — AB (ref 6.5–8.1)

## 2015-06-30 LAB — URINALYSIS COMPLETE WITH MICROSCOPIC (ARMC ONLY)
BILIRUBIN URINE: NEGATIVE
Glucose, UA: NEGATIVE mg/dL
Hgb urine dipstick: NEGATIVE
Leukocytes, UA: NEGATIVE
NITRITE: NEGATIVE
PH: 7 (ref 5.0–8.0)
Protein, ur: NEGATIVE mg/dL
SPECIFIC GRAVITY, URINE: 1.016 (ref 1.005–1.030)
Squamous Epithelial / LPF: NONE SEEN

## 2015-06-30 LAB — CBC
HCT: 50.3 % (ref 40.0–52.0)
HEMOGLOBIN: 17.4 g/dL (ref 13.0–18.0)
MCH: 30 pg (ref 26.0–34.0)
MCHC: 34.6 g/dL (ref 32.0–36.0)
MCV: 86.7 fL (ref 80.0–100.0)
PLATELETS: 329 10*3/uL (ref 150–440)
RBC: 5.81 MIL/uL (ref 4.40–5.90)
RDW: 12.5 % (ref 11.5–14.5)
WBC: 11.6 10*3/uL — AB (ref 3.8–10.6)

## 2015-06-30 LAB — LIPASE, BLOOD: Lipase: 24 U/L (ref 11–51)

## 2015-06-30 MED ORDER — DICYCLOMINE HCL 20 MG PO TABS: 20.0000 mg | ORAL_TABLET | Freq: Three times a day (TID) | ORAL | Status: DC | PRN

## 2015-06-30 MED ORDER — DICYCLOMINE HCL 10 MG/ML IM SOLN
20.0000 mg | Freq: Once | INTRAMUSCULAR | Status: AC
  Administered 2015-06-30: 20 mg via INTRAMUSCULAR
  Filled 2015-06-30: qty 2

## 2015-06-30 MED ORDER — PROMETHAZINE HCL 25 MG/ML IJ SOLN
25.0000 mg | Freq: Once | INTRAMUSCULAR | Status: AC
Start: 2015-06-30 — End: 2015-06-30
  Administered 2015-06-30: 25 mg via INTRAVENOUS
  Filled 2015-06-30 (×2): qty 1

## 2015-06-30 MED ORDER — ACETAMINOPHEN 500 MG PO TABS
1000.0000 mg | ORAL_TABLET | Freq: Once | ORAL | Status: AC
  Administered 2015-06-30: 1000 mg via ORAL
  Filled 2015-06-30: qty 2

## 2015-06-30 MED ORDER — SODIUM CHLORIDE 0.9 % IV BOLUS (SEPSIS)
1000.0000 mL | Freq: Once | INTRAVENOUS | Status: AC
  Administered 2015-06-30: 1000 mL via INTRAVENOUS

## 2015-06-30 MED ORDER — ONDANSETRON 4 MG PO TBDP
4.0000 mg | ORAL_TABLET | Freq: Once | ORAL | Status: AC | PRN
  Administered 2015-06-30: 4 mg via ORAL

## 2015-06-30 MED ORDER — KETOROLAC TROMETHAMINE 30 MG/ML IJ SOLN
30.0000 mg | Freq: Once | INTRAMUSCULAR | Status: AC
  Administered 2015-06-30: 30 mg via INTRAVENOUS
  Filled 2015-06-30: qty 1

## 2015-06-30 MED ORDER — ONDANSETRON HCL 4 MG/2ML IJ SOLN
4.0000 mg | Freq: Once | INTRAMUSCULAR | Status: AC
  Administered 2015-06-30: 4 mg via INTRAVENOUS
  Filled 2015-06-30: qty 2

## 2015-06-30 MED ORDER — CLOTRIMAZOLE 1 % EX CREA: 1.0000 "application " | TOPICAL_CREAM | Freq: Two times a day (BID) | CUTANEOUS | Status: DC

## 2015-06-30 MED ORDER — ONDANSETRON 4 MG PO TBDP
ORAL_TABLET | ORAL | Status: AC
  Filled 2015-06-30: qty 1

## 2015-06-30 MED ORDER — PROMETHAZINE HCL 25 MG PO TABS: 25.0000 mg | ORAL_TABLET | Freq: Four times a day (QID) | ORAL | Status: DC | PRN

## 2015-06-30 NOTE — ED Notes (Signed)
Patient made aware of need of urine sample. States he is unable to go at this time.  

## 2015-06-30 NOTE — ED Notes (Signed)
Pt c/o diarrhea, nausea, rash since yesterday. He has the rash on his groin area. Pt states he has hx of mrsa, but it looks different than the mrsa did. Pt alert & oriented, shaky during triage.

## 2015-06-30 NOTE — ED Provider Notes (Signed)
CSN: [id number]     Arrival date & time 06/30/15  1413 History   First MD Initiated Contact with Patient 06/30/15 [phone]     Chief Complaint  Patient presents with  . Nausea  . Diarrhea  . Rash     (Consider location/radiation/quality/duration/timing/severity/associated sxs/prior Treatment) The history is provided by the patient.  Brian Lutz is a 40 y.o. male hx of MRSA, cholecystectomy here with abdominal pain, vomiting, diarrhea. Symptoms started yesterday. Started with 3-4 episodes of watery diarrhea that is yellowish in color. It is associated with diffuse abdominal cramps worse in the left side of his abdomen. Has chills but denies fevers. He has nausea and two episodes of vomiting. Had similar symptoms prior to his cholecystectomy. Denies recent travel or bad food. Denies sick contacts. Noticed a rash on his groin that is different than his usual MRSA.     Past Medical History  Diagnosis Date  . Asthma   . MRSA (methicillin resistant staph aureus) culture positive    Past Surgical History  Procedure Laterality Date  . Cholecystectomy    . Incision and drainage abscess Left 07/26/2014    Procedure: INCISION AND DRAINAGE LEFT LEG ABSCESS;  Surgeon: Marlyce Huge, MD;  Location: [facility] ORS;  Service: General;  Laterality: Left;   Family History  Problem Relation Age of Onset  . Hypertension Mother   . Asthma Brother    Social History  Substance Use Topics  . Smoking status: Former Research scientist (life sciences)  . Smokeless tobacco: Never Used  . Alcohol Use: No    Review of Systems  Gastrointestinal: Positive for vomiting, abdominal pain and diarrhea.  Skin: Positive for rash.  All other systems reviewed and are negative.     Allergies  Review of patient's allergies indicates no known allergies.  Home Medications   Prior to Admission medications   Medication Sig Start Date End Date Taking? Authorizing Provider  pantoprazole (PROTONIX) 40 MG tablet Take 1 tablet (40 mg total)  by mouth daily. 02/08/15  Yes Bettey Costa, MD  gentamicin (GARAMYCIN) 0.3 % ophthalmic solution Place 1 drop into the left eye 4 (four) times daily. 04/12/15   Sable Feil, PA-C   BP 122/76 mmHg  Pulse 99  Temp(Src) 98.5 F (36.9 C) (Oral)  Resp 18  Ht 6\' 1"  (1.854 m)  Wt 190 lb (86.183 kg)  BMI 25.07 kg/m2  SpO2 97% Physical Exam  Constitutional: He is oriented to person, place, and time. He appears well-nourished.  HENT:  Head: Normocephalic.  MM dry   Eyes: Conjunctivae are normal. Pupils are equal, round, and reactive to light.  Neck: Normal range of motion. Neck supple.  Cardiovascular: Normal rate, regular rhythm and normal heart sounds.   Pulmonary/Chest: Effort normal and breath sounds normal. No respiratory distress. He has no wheezes. He has no rales.  Abdominal: Soft. Bowel sounds are normal.  + epigastric and LUQ tenderness, no periumbilical or RUQ or RLQ tenderness   Genitourinary:  Yeast in the groin area, scrotum nontender. No obvious abscess   Musculoskeletal: Normal range of motion.  Neurological: He is alert and oriented to person, place, and time.  Skin: Skin is warm. There is erythema.  Psychiatric: He has a normal mood and affect. His behavior is normal. Thought content normal.  Nursing note and vitals reviewed.   ED Course  Procedures (including critical care time) Labs Review Labs Reviewed  COMPREHENSIVE METABOLIC PANEL - Abnormal; Notable for the following:    Glucose, Bld 135 (*)  Total Protein 9.2 (*)    Albumin 5.1 (*)    All other components within normal limits  CBC - Abnormal; Notable for the following:    WBC 11.6 (*)    All other components within normal limits  URINALYSIS COMPLETEWITH MICROSCOPIC ([facility] ONLY) - Abnormal; Notable for the following:    Color, Urine YELLOW (*)    APPearance CLEAR (*)    Ketones, ur 1+ (*)    Bacteria, UA RARE (*)    All other components within normal limits  LIPASE, BLOOD    Imaging Review Dg Abd  Acute W/chest  06/30/2015  CLINICAL DATA:  Worsening abdominal pain, diarrhea and vomiting question small bowel obstruction, history irritable bowel syndrome, MR SA, asthma EXAM: DG ABDOMEN ACUTE W/ 1V CHEST COMPARISON:  CT abdomen pelvis 12/14/2011 FINDINGS: Normal heart size, mediastinal contours, and pulmonary vascularity. Lungs clear. No pleural effusion or pneumothorax. Surgical clips RIGHT upper quadrant question cholecystectomy. Bowel gas pattern normal. No bowel dilatation, bowel wall thickening or free intraperitoneal air. Bones unremarkable. No urine tract calcification. IMPRESSION: No acute abnormalities. Electronically Signed   By: Lavonia Dana M.D.   On: 06/30/2015 17:10   I have personally reviewed and evaluated these images and lab results as part of my medical decision-making.   EKG Interpretation None      MDM   Final diagnoses:  None    Brian Lutz is a 40 y.o. male here with abdominal pain, vomiting, yeast in the groin. Likely has gastro. Has recurrent MRSA and yeast infections so I wonder if he has diabetes. He states that he was checked for diabetes and didn't have it but I don't see any Hg A1 C in our system or in care everywhere. Glucose 135 in the ED. Will hydrate, get xray to r/o SBO (hx of cholecystectomy), give bentyl for cramps and IVF.   7:42 PM Glucose 135, higher than previous. Maybe he has pre diabetes. UA + ketones. xrays showed nonobstructive pattern but has a lot of bowel gas, consistent with gastro. Able to keep fluids down, has some chills but no fever in the ED. Abdomen soft and has minimal LUQ tenderness now. Will dc home with phenergan, bentyl, clotrimazole cream. Will have him see [facility] to workup for diabetes/prediabetes.   Wandra Arthurs, MD 06/30/15 2003

## 2015-06-30 NOTE — Discharge Instructions (Signed)
Stay hydrated.   Take phenergan for nausea.   Take tylenol, motrin for fever, chills.   Take bentyl for cramps.   Take imodium as needed for diarrhea, up to 10 pills daily.   Your blood sugar is slightly high. Follow up with [facility] to get workup for diabetes/prediabetes.   See [facility]   Use lotrimin cream to the groin for yeast infection   Return to ER if you have fever, worse abdominal cramps, vomiting, diarrhea

## 2015-07-05 MED ORDER — ONDANSETRON 4 MG PO TBDP
ORAL_TABLET | ORAL | Status: AC
  Filled 2015-07-05: qty 1

## 2015-11-29 ENCOUNTER — Emergency Department
Admission: EM | Admit: 2015-11-29 | Discharge: 2015-11-29 | Disposition: A | Discharge status: Home / Self Care | Payer: Medicaid Other | Attending: Emergency Medicine | Admitting: Emergency Medicine

## 2015-11-29 ENCOUNTER — Encounter: Payer: Self-pay | Admitting: Emergency Medicine

## 2015-11-29 DIAGNOSIS — R112 Nausea with vomiting, unspecified: Secondary | ICD-10-CM | POA: Diagnosis not present

## 2015-11-29 DIAGNOSIS — J45909 Unspecified asthma, uncomplicated: Secondary | ICD-10-CM | POA: Diagnosis not present

## 2015-11-29 DIAGNOSIS — Z87891 Personal history of nicotine dependence: Secondary | ICD-10-CM | POA: Diagnosis not present

## 2015-11-29 DIAGNOSIS — R21 Rash and other nonspecific skin eruption: Secondary | ICD-10-CM | POA: Diagnosis not present

## 2015-11-29 DIAGNOSIS — R1084 Generalized abdominal pain: Secondary | ICD-10-CM | POA: Insufficient documentation

## 2015-11-29 MED ORDER — NYSTATIN 100000 UNIT/GM EX POWD: Freq: Four times a day (QID) | CUTANEOUS | 0 refills | Status: DC

## 2015-11-29 MED ORDER — HYOSCYAMINE SULFATE SL 0.125 MG SL SUBL: 0.1250 mg | SUBLINGUAL_TABLET | Freq: Three times a day (TID) | SUBLINGUAL | 1 refills | Status: DC | PRN

## 2015-11-29 MED ORDER — PROMETHAZINE HCL 25 MG PO TABS: 25.0000 mg | ORAL_TABLET | Freq: Four times a day (QID) | ORAL | 0 refills | Status: DC | PRN

## 2015-11-29 NOTE — ED Triage Notes (Addendum)
Pt to ed with c/o abd pain x 3 years.  Pt states recently had lab tests and they were all negative.  Pt states he needs a referral to gastroenterologist and pain meds.

## 2015-11-29 NOTE — ED Provider Notes (Signed)
[facility] Emergency Department Provider Note        Time seen: ----------------------------------------- 10:43 AM on 11/29/2015 -----------------------------------------    I have reviewed the triage vital signs and the nursing notes.   HISTORY  Chief Complaint Abdominal Pain    HPI Brian Lutz is a 40 y.o. male who presents to ER for abdominal pain for last 3 years.Patient states the pain has been intermittent, he's had multiple visits to ERs for similar. Nothing seems to make his pain better or worse. He did request tramadol here for pain but states he does not really want narcotics he just wants his pain to get better. Patient states he gets nauseous and throws up about 5 days a week.    Past Medical History:  Diagnosis Date  . Asthma   . MRSA (methicillin resistant staph aureus) culture positive     Patient Active Problem List   Diagnosis Date Noted  . Abscess of groin, left 02/05/2015  . Nausea 02/05/2015  . Hx MRSA infection 02/05/2015  . Cellulitis 02/05/2015  . Left leg DVT ([place]) 07/27/2014  . Cellulitis of left leg 07/24/2014    Past Surgical History:  Procedure Laterality Date  . CHOLECYSTECTOMY    . INCISION AND DRAINAGE ABSCESS Left 07/26/2014   Procedure: INCISION AND DRAINAGE LEFT LEG ABSCESS;  Surgeon: Marlyce Huge, MD;  Location: [facility] ORS;  Service: General;  Laterality: Left;    Allergies Patient has no known allergies.  Social History Social History  Substance Use Topics  . Smoking status: Former Research scientist (life sciences)  . Smokeless tobacco: Never Used  . Alcohol use No    Review of Systems Constitutional: Negative for fever. Cardiovascular: Negative for chest pain. Respiratory: Negative for shortness of breath. Gastrointestinal: Positive for vomiting, abdominal pain Genitourinary: Negative for dysuria. Musculoskeletal: Negative for back pain. Skin: Positive for rash in the groin area Neurological: Negative for  headaches, focal weakness or numbness.  10-point ROS otherwise negative.  ____________________________________________   PHYSICAL EXAM:  VITAL SIGNS: ED Triage Vitals [11/29/15 1015]  Enc Vitals Group     BP 122/86     Pulse Rate 98     Resp 18     Temp 97.8 F (36.6 C)     Temp Source Oral     SpO2 97 %     Weight 190 lb (86.2 kg)     Height      Head Circumference      Peak Flow      Pain Score 8     Pain Loc      Pain Edu?      Excl. in [place]?    Constitutional: Alert and oriented. Well appearing and in no distress. Eyes: Conjunctivae are normal. PERRL. Normal extraocular movements. ENT   Head: Normocephalic and atraumatic.   Nose: No congestion/rhinnorhea.   Mouth/Throat: Mucous membranes are moist.   Neck: No stridor. Cardiovascular: Normal rate, regular rhythm. No murmurs, rubs, or gallops. Respiratory: Normal respiratory effort without tachypnea nor retractions. Breath sounds are clear and equal bilaterally. No wheezes/rales/rhonchi. Gastrointestinal: Soft with left sided abdominal tenderness, no rebound or guarding.  Musculoskeletal: Nontender with normal range of motion in all extremities. No lower extremity tenderness nor edema. Neurologic:  Normal speech and language. No gross focal neurologic deficits are appreciated.  Skin:  Skin is warm, dry and intact. Erythematous rash to the groin Psychiatric: Mood and affect are normal. Speech and behavior are normal.  ____________________________________________  ED COURSE:  Pertinent labs &  imaging results that were available during my care of the patient were reviewed by me and considered in my medical decision making (see chart for details). Clinical Course   Patient is in no acute distress, presenting with chronic abdominal pain and nausea.  Procedures ____________________________________________  FINAL ASSESSMENT AND PLAN  Abdominal pain, vomiting, rash  Plan: Patient with labs and imaging as  dictated above. Patient is in no distress, advised we could not prescribe narcotics for chronic pain. I will prescribe Phenergan, Levsin, nystatin and refer him to GI for outpatient follow-up.   Earleen Newport, MD   Note: This dictation was prepared with Dragon dictation. Any transcriptional errors that result from this process are unintentional    Earleen Newport, MD 11/29/15 1120

## 2015-11-30 ENCOUNTER — Other Ambulatory Visit: Payer: Self-pay

## 2015-11-30 ENCOUNTER — Ambulatory Visit (INDEPENDENT_AMBULATORY_CARE_PROVIDER_SITE_OTHER): Payer: Medicaid Other | Admitting: Gastroenterology

## 2015-11-30 ENCOUNTER — Encounter: Payer: Self-pay | Admitting: Gastroenterology

## 2015-11-30 VITALS — BP 108/73 | HR 108 | Temp 97.8°F | Ht 73.0 in | Wt 180.4 lb

## 2015-11-30 DIAGNOSIS — R140 Abdominal distension (gaseous): Secondary | ICD-10-CM | POA: Diagnosis not present

## 2015-11-30 DIAGNOSIS — R197 Diarrhea, unspecified: Secondary | ICD-10-CM | POA: Diagnosis not present

## 2015-11-30 DIAGNOSIS — R1032 Left lower quadrant pain: Secondary | ICD-10-CM

## 2015-11-30 MED ORDER — DOXYCYCLINE HYCLATE 100 MG PO TABS: 100.0000 mg | ORAL_TABLET | Freq: Two times a day (BID) | ORAL | Status: AC

## 2015-11-30 MED ORDER — LOPERAMIDE HCL 2 MG PO TABS: 2.0000 mg | ORAL_TABLET | Freq: Three times a day (TID) | ORAL | 0 refills | Status: DC | PRN

## 2015-11-30 MED ORDER — PEG 3350-KCL-NABCB-NACL-NASULF 236 G PO SOLR: ORAL | 0 refills | Status: DC

## 2015-11-30 NOTE — Progress Notes (Signed)
Gastroenterology Consultation  Referring Provider:     No ref. provider found Primary Care Physician:  No PCP Per Patient Primary Gastroenterologist:  Dr. [name]  Reason for Consultation:     Abdominal pain         HPI:   Brian Lutz is a 40 y.o. y/o male referred for consultation & management  by Dr. Rayne Du PCP Per Patient.    He has a history long standing abdominal pain , diarrhea . He has undergone a cholecystectomy . He was seen at the ER yesterday   Last CT abdomen 07/2015 was normal .   Abdominal pain: Onset: 3-4 years,  Site :left upper side, all the time, sometimes is worse, does wake up him up at night   Radiation: radiates to the back at times. Nature of pain: Squeezing and sharp at times Aggravating factors: certain foods makes the pain worse but he is not able to clearly describe.  Relieving factors :nothing  Weight loss: 15 lbs  NSAID use: does not take on a regular basis.  PPI use :did try but did not help  Gall bladder surgery: yes  Frequency of bowel movements: 3-4 times a day , sometimes more Change in bowel movements: no  Relief with bowel movements: no  Gas/Bloating/Abdominal distension: yes  He feels he looks "pegnant at times", does not smoke tobacco , marijuana, no diet sodas, no chewing gum, no crystallite. Feels worse when he drinks mild  No blood in his stools. No family history of colon cancer.      Past Medical History:  Diagnosis Date  . Asthma   . MRSA (methicillin resistant staph aureus) culture positive     Past Surgical History:  Procedure Laterality Date  . CHOLECYSTECTOMY    . INCISION AND DRAINAGE ABSCESS Left 07/26/2014   Procedure: INCISION AND DRAINAGE LEFT LEG ABSCESS;  Surgeon: Marlyce Huge, MD;  Location: [facility] ORS;  Service: General;  Laterality: Left;    Prior to Admission medications   Medication Sig Start Date End Date Taking? Authorizing Provider  clotrimazole (LOTRIMIN) 1 % cream Apply 1 application  topically 2 (two) times daily. 06/30/15   Drenda Freeze, MD  dicyclomine (BENTYL) 20 MG tablet Take 1 tablet (20 mg total) by mouth 3 (three) times daily as needed for spasms. 06/30/15 06/29/16  Drenda Freeze, MD  famotidine (PEPCID) 20 MG tablet Take 20 mg by mouth. 07/21/15   Historical Provider, MD  gentamicin (GARAMYCIN) 0.3 % ophthalmic solution Place 1 drop into the left eye 4 (four) times daily. 04/12/15   Sable Feil, PA-C  Hyoscyamine Sulfate SL (LEVSIN/SL) 0.125 MG SUBL Place 0.125 mg under the tongue 3 (three) times daily as needed. 11/29/15   Earleen Newport, MD  nystatin (MYCOSTATIN/NYSTOP) powder Apply topically 4 (four) times daily. 11/29/15   Earleen Newport, MD  pantoprazole (PROTONIX) 40 MG tablet Take 1 tablet (40 mg total) by mouth daily. 02/08/15   Bettey Costa, MD  promethazine (PHENERGAN) 25 MG tablet Take 1 tablet (25 mg total) by mouth every 6 (six) hours as needed for nausea or vomiting. 06/30/15   Drenda Freeze, MD  promethazine (PHENERGAN) 25 MG tablet Take 1 tablet (25 mg total) by mouth every 6 (six) hours as needed for nausea or vomiting. 11/29/15   Earleen Newport, MD    Family History  Problem Relation Age of Onset  . Hypertension Mother   . Asthma Brother      Social  History  Substance Use Topics  . Smoking status: Former Research scientist (life sciences)  . Smokeless tobacco: Never Used  . Alcohol use No    Allergies as of 11/30/2015  . (No Known Allergies)    Review of Systems:    All systems reviewed and negative except where noted in HPI.   Physical Exam:  There were no vitals taken for this visit. No LMP for male patient. Psych:  Alert and cooperative. Normal mood and affect. General:   Alert,  Well-developed, well-nourished, pleasant and cooperative in NAD Head:  Normocephalic and atraumatic. Eyes:  Sclera clear, no icterus.   Conjunctiva pink. Ears:  Normal auditory acuity. Nose:  No deformity, discharge, or lesions. Mouth:  No deformity or  lesions,oropharynx pink & moist. Neck:  Supple; no masses or thyromegaly. Lungs:  Respirations even and unlabored.  Clear throughout to auscultation.   No wheezes, crackles, or rhonchi. No acute distress. Heart:  Regular rate and rhythm; no murmurs, clicks, rubs, or gallops. Abdomen:  Normal bowel sounds.  No bruits.  Soft, non-tender and non-distended without masses, hepatosplenomegaly or hernias noted.  No guarding or rebound tenderness.    Msk:  Symmetrical without gross deformities. Good, equal movement & strength bilaterally. Pulses:  Normal pulses noted. Extremities:  No clubbing or edema.  No cyanosis. Neurologic:  Alert and oriented x3;  grossly normal neurologically. Skin:  Intact without significant lesions or rashes. No jaundice. Lymph Nodes:  No significant cervical adenopathy. Psych:  Alert and cooperative. Normal mood and affect.  Imaging Studies: No results found. There were no vitals taken for this visit.  Assessment and Plan:   Brian Lutz is a 40 y.o. y/o male has been referred for chronic abdominal pain of 3 years duration with on and off diarrhea, gas and bloating . Differentials are wide at this time ranging from IBS-diarrhea , small bowel bacterial overgrowth and functional dyspepsia.   I will obtain stool tests to rule infection , perform bidirectional endoscopy , treat empirically for small bowel bacterial overgrowth with doxycycline.   Follow up in 8-12 weeks  Dr [name] MD

## 2015-12-01 ENCOUNTER — Encounter: Payer: Self-pay | Admitting: *Deleted

## 2015-12-02 ENCOUNTER — Encounter
Admission: RE | Disposition: A | Discharge status: Home / Self Care | Payer: Self-pay | Source: Ambulatory Visit | Attending: Gastroenterology

## 2015-12-02 ENCOUNTER — Encounter: Payer: Self-pay | Admitting: *Deleted

## 2015-12-02 ENCOUNTER — Telehealth: Payer: Self-pay

## 2015-12-02 ENCOUNTER — Ambulatory Visit
Admission: RE | Admit: 2015-12-02 | Discharge: 2015-12-02 | Disposition: A | Discharge status: Home / Self Care | Payer: Medicaid Other | Source: Ambulatory Visit | Attending: Gastroenterology | Admitting: Gastroenterology

## 2015-12-02 ENCOUNTER — Ambulatory Visit: Payer: Medicaid Other | Admitting: Certified Registered Nurse Anesthetist

## 2015-12-02 DIAGNOSIS — Z8614 Personal history of Methicillin resistant Staphylococcus aureus infection: Secondary | ICD-10-CM | POA: Diagnosis not present

## 2015-12-02 DIAGNOSIS — R1013 Epigastric pain: Secondary | ICD-10-CM

## 2015-12-02 DIAGNOSIS — J45909 Unspecified asthma, uncomplicated: Secondary | ICD-10-CM | POA: Diagnosis not present

## 2015-12-02 DIAGNOSIS — K529 Noninfective gastroenteritis and colitis, unspecified: Secondary | ICD-10-CM | POA: Diagnosis not present

## 2015-12-02 DIAGNOSIS — K2950 Unspecified chronic gastritis without bleeding: Secondary | ICD-10-CM | POA: Diagnosis not present

## 2015-12-02 DIAGNOSIS — Z87891 Personal history of nicotine dependence: Secondary | ICD-10-CM | POA: Insufficient documentation

## 2015-12-02 DIAGNOSIS — Z86718 Personal history of other venous thrombosis and embolism: Secondary | ICD-10-CM | POA: Diagnosis not present

## 2015-12-02 DIAGNOSIS — K219 Gastro-esophageal reflux disease without esophagitis: Secondary | ICD-10-CM | POA: Diagnosis not present

## 2015-12-02 DIAGNOSIS — D125 Benign neoplasm of sigmoid colon: Secondary | ICD-10-CM | POA: Diagnosis not present

## 2015-12-02 HISTORY — PX: ESOPHAGOGASTRODUODENOSCOPY (EGD) WITH PROPOFOL: SHX5813

## 2015-12-02 HISTORY — PX: COLONOSCOPY WITH PROPOFOL: SHX5780

## 2015-12-02 SURGERY — COLONOSCOPY WITH PROPOFOL
Anesthesia: General

## 2015-12-02 MED ORDER — FENTANYL CITRATE (PF) 100 MCG/2ML IJ SOLN
INTRAMUSCULAR | Status: DC | PRN
  Administered 2015-12-02: 50 ug via INTRAVENOUS

## 2015-12-02 MED ORDER — DOXYCYCLINE HYCLATE 100 MG PO CAPS: 100.0000 mg | ORAL_CAPSULE | Freq: Two times a day (BID) | ORAL | 0 refills | Status: DC

## 2015-12-02 MED ORDER — PHENYLEPHRINE HCL 10 MG/ML IJ SOLN
INTRAMUSCULAR | Status: DC | PRN
  Administered 2015-12-02: 50 ug via INTRAVENOUS

## 2015-12-02 MED ORDER — SODIUM CHLORIDE 0.9 % IV SOLN
INTRAVENOUS | Status: DC
  Administered 2015-12-02: 09:00:00 via INTRAVENOUS

## 2015-12-02 MED ORDER — LIDOCAINE HCL (CARDIAC) 20 MG/ML IV SOLN
INTRAVENOUS | Status: DC | PRN
  Administered 2015-12-02: 100 mg via INTRAVENOUS

## 2015-12-02 MED ORDER — ONDANSETRON HCL 4 MG/2ML IJ SOLN
INTRAMUSCULAR | Status: DC | PRN
  Administered 2015-12-02: 4 mg via INTRAVENOUS

## 2015-12-02 MED ORDER — PROPOFOL 10 MG/ML IV BOLUS
INTRAVENOUS | Status: DC | PRN
  Administered 2015-12-02: 80 mg via INTRAVENOUS

## 2015-12-02 MED ORDER — MIDAZOLAM HCL 2 MG/2ML IJ SOLN
INTRAMUSCULAR | Status: DC | PRN
  Administered 2015-12-02: 2 mg via INTRAVENOUS

## 2015-12-02 MED ORDER — PROPOFOL 500 MG/50ML IV EMUL
INTRAVENOUS | Status: DC | PRN
  Administered 2015-12-02: 140 ug/kg/min via INTRAVENOUS

## 2015-12-02 NOTE — Telephone Encounter (Signed)
Patient called in and states that his doxycycline was not sent to pharmacy. Pt prefers [place]  This was placed as a hospital order. So, I did change this to an outpatient order at this time.  Patient was notified that this has been called in and he is to pick this up at [facility] and start immediately.

## 2015-12-02 NOTE — Op Note (Signed)
[facility] Gastroenterology Patient Name: Brian Lutz Procedure Date: 12/02/2015 9:31 AM MRN: [id number] Account #: [id number] Date of Birth: July 31, 1975 Admit Type: Outpatient Age: 40 Room: [facility] ENDO ROOM 1 Gender: Male Note Status: Finalized Procedure:            Colonoscopy Indications:          Chronic diarrhea Providers:            [name] MD, MD Referring MD:         No Local Md, MD (Referring MD) Medicines:            Monitored Anesthesia Care Complications:        No immediate complications. Procedure:            Pre-Anesthesia Assessment:                       - Prior to the procedure, a History and Physical was                        performed, and patient medications, allergies and                        sensitivities were reviewed. The patient's tolerance of                        previous anesthesia was reviewed.                       - The risks and benefits of the procedure and the                        sedation options and risks were discussed with the                        patient. All questions were answered and informed                        consent was obtained.                       - The risks and benefits of the procedure and the                        sedation options and risks were discussed with the                        patient. All questions were answered and informed                        consent was obtained.                       - ASA Grade Assessment: II - A patient with mild                        systemic disease.                       After obtaining informed consent, the colonoscope was  passed under direct vision. Throughout the procedure,                        the patient's blood pressure, pulse, and oxygen                        saturations were monitored continuously. The                        Colonoscope was introduced through the anus and                        advanced to the the  terminal ileum. The colonoscopy was                        performed with ease. The patient tolerated the                        procedure well. The quality of the bowel preparation                        was fair. Findings:      The perianal and digital rectal examinations were normal.      A 5 mm polyp was found in the sigmoid colon. The polyp was sessile. The       polyp was removed with a cold biopsy forceps. Resection and retrieval       were complete.      The exam was otherwise without abnormality on direct and retroflexion       views.      Biopsies for histology were taken with a cold forceps from the entire       colon for evaluation of microscopic colitis. Impression:           - Preparation of the colon was fair.                       - One 5 mm polyp in the sigmoid colon, removed with a                        cold biopsy forceps. Resected and retrieved.                       - The examination was otherwise normal on direct and                        retroflexion views.                       - Biopsies were taken with a cold forceps from the                        entire colon for evaluation of microscopic colitis. Recommendation:       - Patient has a contact number available for                        emergencies. The signs and symptoms of potential                        delayed complications were discussed with the patient.  Return to normal activities tomorrow. Written discharge                        instructions were provided to the patient.                       - Resume previous diet.                       - Continue present medications.                       - Await pathology results.                       - Repeat colonoscopy at age 74 for screening purposes.                       - Return to GI clinic as previously scheduled.                       - Discharge patient to home (with escort). Procedure Code(s):    --- Professional ---                        [phone], Colonoscopy, flexible; with biopsy, single or                        multiple Diagnosis Code(s):    --- Professional ---                       D12.5, Benign neoplasm of sigmoid colon                       K52.9, Noninfective gastroenteritis and colitis,                        unspecified CPT copyright 2016 American Medical Association. All rights reserved. The codes documented in this report are preliminary and upon coder review may  be revised to meet current compliance requirements. [name], MD [name] MD, MD 12/02/2015 10:10:13 AM This report has been signed electronically. Number of Addenda: 0 Note Initiated On: 12/02/2015 9:31 AM Scope Withdrawal Time: 0 hours 13 minutes 38 seconds  Total Procedure Duration: 0 hours 18 minutes 7 seconds       [facility]

## 2015-12-02 NOTE — Anesthesia Preprocedure Evaluation (Signed)
Anesthesia Evaluation  Patient identified by MRN, date of birth, ID band Patient awake    Reviewed: Allergy & Precautions, NPO status , Patient's Chart, lab work & pertinent test results  History of Anesthesia Complications Negative for: history of anesthetic complications  Airway Mallampati: II       Dental   Pulmonary asthma , former smoker,           Cardiovascular + Peripheral Vascular Disease (s/p DVT)       Neuro/Psych negative neurological ROS     GI/Hepatic Neg liver ROS, GERD  Medicated,  Endo/Other  negative endocrine ROS  Renal/GU negative Renal ROS     Musculoskeletal   Abdominal   Peds  Hematology negative hematology ROS (+)   Anesthesia Other Findings   Reproductive/Obstetrics                             Anesthesia Physical Anesthesia Plan  ASA: II  Anesthesia Plan: General   Post-op Pain Management:    Induction: Intravenous  Airway Management Planned: Nasal Cannula  Additional Equipment:   Intra-op Plan:   Post-operative Plan:   Informed Consent: I have reviewed the patients History and Physical, chart, labs and discussed the procedure including the risks, benefits and alternatives for the proposed anesthesia with the patient or authorized representative who has indicated his/her understanding and acceptance.     Plan Discussed with:   Anesthesia Plan Comments:         Anesthesia Quick Evaluation

## 2015-12-02 NOTE — Op Note (Signed)
[facility] Gastroenterology Patient Name: Brian Lutz Procedure Date: 12/02/2015 9:31 AM MRN: [id number] Account #: [id number] Date of Birth: January 29, 1975 Admit Type: Outpatient Age: 40 Room: [facility] ENDO ROOM 1 Gender: Male Note Status: Finalized Procedure:            Upper GI endoscopy Indications:          Epigastric abdominal pain Providers:            [name] MD, MD Referring MD:         No Local Md, MD (Referring MD) Medicines:            Monitored Anesthesia Care Complications:        No immediate complications. Procedure:            Pre-Anesthesia Assessment:                       - Prior to the procedure, a History and Physical was                        performed, and patient medications, allergies and                        sensitivities were reviewed. The patient's tolerance of                        previous anesthesia was reviewed.                       - The risks and benefits of the procedure and the                        sedation options and risks were discussed with the                        patient. All questions were answered and informed                        consent was obtained.                       - The risks and benefits of the procedure and the                        sedation options and risks were discussed with the                        patient. All questions were answered and informed                        consent was obtained.                       - ASA Grade Assessment: II - A patient with mild                        systemic disease.                       After obtaining informed consent, the endoscope was  passed under direct vision. Throughout the procedure,                        the patient's blood pressure, pulse, and oxygen                        saturations were monitored continuously. The Endoscope                        was introduced through the mouth, and advanced to the       third part of duodenum. The upper GI endoscopy was                        accomplished with ease. The patient tolerated the                        procedure well. Findings:      The examined duodenum was normal. Biopsies for histology were taken with       a cold forceps for evaluation of celiac disease.      The esophagus was normal.      The entire examined stomach was normal. Biopsies were taken with a cold       forceps for histology. Impression:           - Normal examined duodenum. Biopsied.                       - Normal esophagus.                       - Normal stomach. Biopsied. Recommendation:       - Patient has a contact number available for                        emergencies. The signs and symptoms of potential                        delayed complications were discussed with the patient.                        Return to normal activities tomorrow. Written discharge                        instructions were provided to the patient.                       - Resume previous diet.                       - Continue present medications.                       - Await pathology results.                       - No repeat upper endoscopy.                       - Return to GI office as previously scheduled. Procedure Code(s):    --- Professional ---  [id number], Esophagogastroduodenoscopy, flexible, transoral;                        with biopsy, single or multiple Diagnosis Code(s):    --- Professional ---                       R10.13, Epigastric pain CPT copyright 2016 American Medical Association. All rights reserved. The codes documented in this report are preliminary and upon coder review may  be revised to meet current compliance requirements. [name], MD [name] MD, MD 12/02/2015 9:46:04 AM This report has been signed electronically. Number of Addenda: 0 Note Initiated On: 12/02/2015 9:31 AM      [facility]

## 2015-12-02 NOTE — Transfer of Care (Signed)
Immediate Anesthesia Transfer of Care Note  Patient: Brian Lutz  Procedure(s) Performed: Procedure(s): COLONOSCOPY WITH PROPOFOL (N/A) ESOPHAGOGASTRODUODENOSCOPY (EGD) WITH PROPOFOL (N/A)  Patient Location: PACU  Anesthesia Type:General  Level of Consciousness: awake and alert   Airway & Oxygen Therapy: Patient Spontanous Breathing and Patient connected to nasal cannula oxygen  Post-op Assessment: Report given to RN and Post -op Vital signs reviewed and stable  Post vital signs: Reviewed and stable  Last Vitals:  Vitals:   12/02/15 0913  BP: 117/84  Pulse: 80  Resp: 14  Temp: 36.4 C    Last Pain:  Vitals:   12/02/15 0913  TempSrc: Tympanic         Complications: No apparent anesthesia complications

## 2015-12-02 NOTE — Anesthesia Postprocedure Evaluation (Signed)
Anesthesia Post Note  Patient: TAYJON TRETTEL  Procedure(s) Performed: Procedure(s) (LRB): COLONOSCOPY WITH PROPOFOL (N/A) ESOPHAGOGASTRODUODENOSCOPY (EGD) WITH PROPOFOL (N/A)  Patient location during evaluation: Endoscopy Anesthesia Type: General Level of consciousness: awake and alert Pain management: pain level controlled Vital Signs Assessment: post-procedure vital signs reviewed and stable Respiratory status: spontaneous breathing and respiratory function stable Cardiovascular status: stable Anesthetic complications: no    Last Vitals:  Vitals:   12/02/15 0913 12/02/15 1015  BP: 117/84 (!) 88/53  Pulse: 80 76  Resp: 14 14  Temp: 36.4 C 36.4 C    Last Pain:  Vitals:   12/02/15 1015  TempSrc: Tympanic  PainSc: 0-No pain                 Frederic Tones K

## 2015-12-02 NOTE — H&P (Signed)
[name] MD [address], [place], [place] [address] Phone: [phone] Fax : [phone]  Primary Care Physician:  No PCP Per Patient Primary Gastroenterologist:  Dr. [name]   Pre-Procedure History & Physical: HPI:  Brian Lutz is a 40 y.o. male is here for an endoscopy and colonoscopy.   Past Medical History:  Diagnosis Date  . Asthma   . MRSA (methicillin resistant staph aureus) culture positive     Past Surgical History:  Procedure Laterality Date  . CHOLECYSTECTOMY    . INCISION AND DRAINAGE ABSCESS Left 07/26/2014   Procedure: INCISION AND DRAINAGE LEFT LEG ABSCESS;  Surgeon: Marlyce Huge, MD;  Location: [facility] ORS;  Service: General;  Laterality: Left;    Prior to Admission medications   Medication Sig Start Date End Date Taking? Authorizing Provider  polyethylene glycol (GOLYTELY) 236 g solution Drink one 8 oz glass every 20 mins until stools are clear. 11/30/15  Yes [name], MD  clotrimazole (LOTRIMIN) 1 % cream Apply 1 application topically 2 (two) times daily. Patient not taking: Reported on 11/30/2015 06/30/15   Drenda Freeze, MD  dicyclomine (BENTYL) 20 MG tablet Take 1 tablet (20 mg total) by mouth 3 (three) times daily as needed for spasms. Patient not taking: Reported on 11/30/2015 06/30/15 06/29/16  Drenda Freeze, MD  famotidine (PEPCID) 20 MG tablet Take 20 mg by mouth. 07/21/15   Historical Provider, MD  gentamicin (GARAMYCIN) 0.3 % ophthalmic solution Place 1 drop into the left eye 4 (four) times daily. 04/12/15   Sable Feil, PA-C  Hyoscyamine Sulfate SL (LEVSIN/SL) 0.125 MG SUBL Place 0.125 mg under the tongue 3 (three) times daily as needed. 11/29/15   Earleen Newport, MD  loperamide (IMODIUM A-D) 2 MG tablet Take 1 tablet (2 mg total) by mouth 3 (three) times daily as needed for diarrhea or loose stools. 11/30/15 12/30/15  [name], MD  nystatin (MYCOSTATIN/NYSTOP) powder Apply topically 4 (four) times daily.  11/29/15   Earleen Newport, MD  pantoprazole (PROTONIX) 40 MG tablet Take 1 tablet (40 mg total) by mouth daily. 02/08/15   Bettey Costa, MD  promethazine (PHENERGAN) 25 MG tablet Take 1 tablet (25 mg total) by mouth every 6 (six) hours as needed for nausea or vomiting. 06/30/15   Drenda Freeze, MD  promethazine (PHENERGAN) 25 MG tablet Take 1 tablet (25 mg total) by mouth every 6 (six) hours as needed for nausea or vomiting. 11/29/15   Earleen Newport, MD    Allergies as of 11/30/2015  . (No Known Allergies)    Family History  Problem Relation Age of Onset  . Hypertension Mother   . Asthma Brother     Social History   Social History  . Marital status: Single    Spouse name: N/A  . Number of children: N/A  . Years of education: N/A   Occupational History  . Not on file.   Social History Main Topics  . Smoking status: Former Research scientist (life sciences)  . Smokeless tobacco: Never Used  . Alcohol use No  . Drug use: No  . Sexual activity: Not on file   Other Topics Concern  . Not on file   Social History Narrative  . No narrative on file    Review of Systems: See HPI, otherwise negative ROS  Physical Exam: BP 117/84   Pulse 80   Temp 97.5 F (36.4 C) (Tympanic)   Resp 14   Ht 6\' 1"  (1.854 m)  Wt 180 lb (81.6 kg)   SpO2 100%   BMI 23.75 kg/m  General:   Alert,  pleasant and cooperative in NAD Head:  Normocephalic and atraumatic. Neck:  Supple; no masses or thyromegaly. Lungs:  Clear throughout to auscultation.    Heart:  Regular rate and rhythm. Abdomen:  Soft, nontender and nondistended. Normal bowel sounds, without guarding, and without rebound.   Neurologic:  Alert and  oriented x4;  grossly normal neurologically.  Impression/Plan: Brian Lutz is here for an endoscopy and colonoscopy to be performed for diarrhea,gas and abdominal pain  Risks, benefits, limitations, and alternatives regarding  endoscopy and colonoscopy have been reviewed with the patient.   Questions have been answered.  All parties agreeable.   [name], MD  12/02/2015, 9:21 AM

## 2015-12-02 NOTE — Anesthesia Procedure Notes (Signed)
Performed by: Cain Fitzhenry Pre-anesthesia Checklist: Patient identified, Emergency Drugs available, Suction available, Patient being monitored and Timeout performed Patient Re-evaluated:Patient Re-evaluated prior to inductionOxygen Delivery Method: Nasal cannula Intubation Type: IV induction       

## 2015-12-05 ENCOUNTER — Encounter: Payer: Self-pay | Admitting: Gastroenterology

## 2015-12-05 LAB — SURGICAL PATHOLOGY

## 2015-12-07 ENCOUNTER — Telehealth: Payer: Self-pay

## 2015-12-07 NOTE — Telephone Encounter (Signed)
-----   Message from [name], MD sent at 12/07/2015 10:13 AM EST ----- Normal except for chronic inactive gastritis

## 2015-12-07 NOTE — Telephone Encounter (Signed)
Lvm for pt to return my call regarding procedure results. Home number is not working.

## 2015-12-07 NOTE — Telephone Encounter (Signed)
-----   Message from [name], MD sent at 12/07/2015 10:14 AM EST ----- Tubular adenoma of the colon - repeat colonoscopy in 5 years. At age 40 .

## 2015-12-09 NOTE — Telephone Encounter (Signed)
Left vm again on cell to return my call. Home number is still not working.

## 2015-12-13 NOTE — Telephone Encounter (Signed)
Patient called and states that his sister changed her number and we have not been able to contact him due to this. Demographics updated with patient at this time. Patient is requesting a phone call to new number with results from procedure.

## 2015-12-14 NOTE — Telephone Encounter (Signed)
Tried contacting pt with new number provided. No vm was set up to leave a message.

## 2015-12-15 NOTE — Telephone Encounter (Signed)
Pt notified of EGD/colonoscopy. Pt still having issues. Scheduled pt for a follow up appt.

## 2015-12-20 ENCOUNTER — Other Ambulatory Visit: Payer: Self-pay

## 2015-12-20 ENCOUNTER — Other Ambulatory Visit
Admission: RE | Admit: 2015-12-20 | Discharge: 2015-12-20 | Disposition: A | Discharge status: Home / Self Care | Payer: Medicaid Other | Source: Ambulatory Visit | Attending: Gastroenterology | Admitting: Gastroenterology

## 2015-12-20 ENCOUNTER — Encounter: Payer: Self-pay | Admitting: Gastroenterology

## 2015-12-20 ENCOUNTER — Ambulatory Visit (INDEPENDENT_AMBULATORY_CARE_PROVIDER_SITE_OTHER): Payer: Medicaid Other | Admitting: Gastroenterology

## 2015-12-20 VITALS — BP 129/84 | HR 67 | Temp 97.4°F | Ht 73.0 in | Wt 181.0 lb

## 2015-12-20 DIAGNOSIS — R140 Abdominal distension (gaseous): Secondary | ICD-10-CM | POA: Diagnosis not present

## 2015-12-20 DIAGNOSIS — R197 Diarrhea, unspecified: Secondary | ICD-10-CM | POA: Diagnosis not present

## 2015-12-20 DIAGNOSIS — R1032 Left lower quadrant pain: Secondary | ICD-10-CM | POA: Insufficient documentation

## 2015-12-20 DIAGNOSIS — R1013 Epigastric pain: Secondary | ICD-10-CM

## 2015-12-20 LAB — TSH: TSH: 2.198 u[IU]/mL (ref 0.350–4.500)

## 2015-12-20 MED ORDER — DICYCLOMINE HCL 10 MG PO CAPS: 10.0000 mg | ORAL_CAPSULE | Freq: Three times a day (TID) | ORAL | 1 refills | Status: DC

## 2015-12-20 MED ORDER — DEXLANSOPRAZOLE 60 MG PO CPDR: 60.0000 mg | DELAYED_RELEASE_CAPSULE | Freq: Every day | ORAL | 2 refills | Status: DC

## 2015-12-20 MED ORDER — AMOXICILLIN-POT CLAVULANATE 875-125 MG PO TABS: 1.0000 | ORAL_TABLET | Freq: Two times a day (BID) | ORAL | 0 refills | Status: AC

## 2015-12-20 MED ORDER — SUCRALFATE 1 GM/10ML PO SUSP: 1.0000 g | Freq: Three times a day (TID) | ORAL | 0 refills | Status: DC

## 2015-12-20 NOTE — Patient Instructions (Signed)
Please go to to the medical mall to have your labs drawn. Please see directions below.  Begin your Augmentin and take twice daily until completed. DO NOT STOP THIS MEDICATION. If you are having difficulties taking this or side effects, even with food; call our office to have this changed.  Directions to Medical Mall: When leaving our office, go right. Go all of the way down to the very end of the hallway. You will have a purple wall in front of you. You will now have a tunnel to the hospital on your left hand side. Go through this tunnel and the elevators will be on your left. Go down to the 1st floor and take a slight left. The very first desk on the right hand side is the registration desk.

## 2015-12-20 NOTE — Progress Notes (Signed)
Primary Care Physician: No PCP Per Patient  Primary Gastroenterologist:  Dr. [name]   Chief Complaint  Patient presents with  . Follow-up    Procedure Results  . Nausea  . Abdominal Pain    HPI: Brian Lutz is a 40 y.o. male   He is here today as a follow up to his initial visit on 11/30/15 when I saw him for diarrhea,abdominal pain for 3-4 years duration .He has undergone a cholecystectomy . Last CT abdomen 07/2015 was normal . He also mentioned weight loss of 15 lbs   Interval history 11/30/15-12/20/15  None of the stool or lab tests were obtained.  EGD was normal with normal duodenal biopsies .  On his colonoscopy , excised a small polyp which was a tubular adenoma and suggested a recall of 5 years . Biopsies of his terminal ileum and random colon biopsies were normal,   He has been started on Sumitriptan and Reglan by his doctor.   He was started on Pantoprazole . Still feeling bloated. Gained weight . Reglan is helping with nausea. No exposure to marijuana.  Tried doxycycline which made him very sick. I prescribed it for treatment of SIBO.  Feels pantoprazole is working .  No diarrhea presently. Does feel more abdominal discomfort when he Is more anxious.  Current Outpatient Prescriptions  Medication Sig Dispense Refill  . clotrimazole (LOTRIMIN) 1 % cream Apply 1 application topically 2 (two) times daily. (Patient not taking: Reported on 11/30/2015) 30 g 0  . dicyclomine (BENTYL) 20 MG tablet Take 1 tablet (20 mg total) by mouth 3 (three) times daily as needed for spasms. (Patient not taking: Reported on 11/30/2015) 10 tablet 0  . doxycycline (VIBRAMYCIN) 100 MG capsule Take 1 capsule (100 mg total) by mouth 2 (two) times daily. 20 capsule 0  . famotidine (PEPCID) 20 MG tablet Take 20 mg by mouth.    Marland Kitchen gentamicin (GARAMYCIN) 0.3 % ophthalmic solution Place 1 drop into the left eye 4 (four) times daily. 5 mL 0  . Hyoscyamine Sulfate SL (LEVSIN/SL) 0.125 MG SUBL  Place 0.125 mg under the tongue 3 (three) times daily as needed. 30 each 1  . loperamide (IMODIUM A-D) 2 MG tablet Take 1 tablet (2 mg total) by mouth 3 (three) times daily as needed for diarrhea or loose stools. 30 tablet 0  . nystatin (MYCOSTATIN/NYSTOP) powder Apply topically 4 (four) times daily. 15 g 0  . pantoprazole (PROTONIX) 40 MG tablet Take 1 tablet (40 mg total) by mouth daily. 30 tablet 0  . polyethylene glycol (GOLYTELY) 236 g solution Drink one 8 oz glass every 20 mins until stools are clear. 4000 mL 0   No current facility-administered medications for this visit.     Allergies as of 12/20/2015  . (No Known Allergies)    ROS:  General: Negative for anorexia, weight loss, fever, chills, fatigue, weakness. ENT: Negative for hoarseness, difficulty swallowing , nasal congestion. CV: Negative for chest pain, angina, palpitations, dyspnea on exertion, peripheral edema.  Respiratory: Negative for dyspnea at rest, dyspnea on exertion, cough, sputum, wheezing.  GI: See history of present illness. GU:  Negative for dysuria, hematuria, urinary incontinence, urinary frequency, nocturnal urination.  Endo: Negative for unusual weight change.    Physical Examination:   There were no vitals taken for this visit.  General: Well-nourished, well-developed in no acute distress. Strong odor of either nicotine or marijuana (he completely denies using THC) Eyes: No icterus. Conjunctivae pink. Mouth: Oropharyngeal mucosa  moist and pink , no lesions erythema or exudate. Lungs: Clear to auscultation bilaterally. Non-labored. Heart: Regular rate and rhythm, no murmurs rubs or gallops.  Abdomen: Bowel sounds are normal, nontender, nondistended, no hepatosplenomegaly or masses, no abdominal bruits or hernia , no rebound or guarding.   Extremities: No lower extremity edema. No clubbing or deformities. Neuro: Alert and oriented x 3.  Grossly intact. Skin: Warm and dry, no jaundice.   Psych: Alert  and cooperative, normal mood and affect.   Imaging Studies: No results found.  Assessment and Plan:   Brian Lutz is a 40 y.o. y/o male here for follow up . His main symptoms are nausea, bloating . EGD was normal in addition to CT abdomen . Colonoscopy showed a small tubular adenoma . Attempted to treat him with doxycyline for SIBO which he didn't tolerate  Plan   1. 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 Urine screen for exposure to [facility] as it may cause cannaboid hyperemesis syndrome and gastroparesis.  3. Continue PPI 4. 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 Repeat colonoscopy in 5 years.   Dr [name]  MD Follow up in 8 weeks.

## 2015-12-23 LAB — CELIAC DISEASE PANEL
ENDOMYSIAL ANTIBODY IGA: NEGATIVE
IgA: 204 mg/dL (ref 90–386)
Tissue Transglutaminase Ab, IgA: <2 U/mL (ref 0–3)

## 2016-01-21 ENCOUNTER — Encounter: Payer: Self-pay | Admitting: Emergency Medicine

## 2016-01-21 ENCOUNTER — Emergency Department
Admission: EM | Admit: 2016-01-21 | Discharge: 2016-01-21 | Disposition: A | Discharge status: Home / Self Care | Payer: Medicaid Other | Attending: Emergency Medicine | Admitting: Emergency Medicine

## 2016-01-21 DIAGNOSIS — Z87891 Personal history of nicotine dependence: Secondary | ICD-10-CM | POA: Insufficient documentation

## 2016-01-21 DIAGNOSIS — J45909 Unspecified asthma, uncomplicated: Secondary | ICD-10-CM | POA: Insufficient documentation

## 2016-01-21 DIAGNOSIS — K047 Periapical abscess without sinus: Secondary | ICD-10-CM | POA: Diagnosis not present

## 2016-01-21 DIAGNOSIS — K0889 Other specified disorders of teeth and supporting structures: Secondary | ICD-10-CM | POA: Diagnosis present

## 2016-01-21 MED ORDER — HYDROCODONE-ACETAMINOPHEN 5-325 MG PO TABS
1.0000 | ORAL_TABLET | Freq: Once | ORAL | Status: AC
  Administered 2016-01-21: 1 via ORAL
  Filled 2016-01-21: qty 1

## 2016-01-21 MED ORDER — AMOXICILLIN 500 MG PO CAPS: 500.0000 mg | ORAL_CAPSULE | Freq: Three times a day (TID) | ORAL | 0 refills | Status: AC

## 2016-01-21 MED ORDER — TRAMADOL HCL 50 MG PO TABS: 50.0000 mg | ORAL_TABLET | Freq: Four times a day (QID) | ORAL | 0 refills | Status: DC | PRN

## 2016-01-21 MED ORDER — AMOXICILLIN 500 MG PO CAPS: 500.0000 mg | ORAL_CAPSULE | Freq: Three times a day (TID) | ORAL | 0 refills | Status: DC

## 2016-01-21 NOTE — ED Notes (Addendum)
Dental pain on left side, worse since yesterday. Unable to get in dentist this week due to weather, planning to go Monday, encouraged patient to fill RX as soon as possible before pharmacies close for the evening.

## 2016-01-21 NOTE — ED Triage Notes (Signed)
Pt reports left lower jaw pain for two days.

## 2016-01-21 NOTE — Discharge Instructions (Signed)
OPTIONS FOR DENTAL FOLLOW UP CARE ° °[place] Department of Health and Human Services - Local Safety Net Dental Clinics °http://www.ncdhhs.gov/dph/oralhealth/services/safetynetclinics.htm °  °[facility] (336-562-3123) ° °[facility] [place] (919-933-9087) ° °[facility] (919-663-1744 ext [phone]) ° °[facility] ([phone]) ° °[facility] ([phone]) °This clinic caters to the indigent population and is on a lottery system. °Location: °[facility], [address], [place] °Clinic Hours: °Wednesdays from 6pm - 9pm, patients seen by a lottery system. °For dates, call or go to www.med.[facility].edu/[facility]/patients/Dental-[facility] °Services: °Cleanings, fillings and simple extractions. °Payment Options: °DENTAL WORK IS FREE OF CHARGE. Bring proof of income or support. °Best way to get seen: °Arrive at 5:15 pm - this is a lottery, NOT first come/first serve, so arriving earlier will not increase your chances of being seen. °  °  °[facility] °[phone] °Select option 1 for emergencies °  °Location: °[facility], [address], [place] °Clinic Hours: °No walk-ins accepted - call the day before to schedule an appointment. °Check in times are 9:30 am and 1:30 pm. °Services: °Simple extractions, temporary fillings, pulpectomy/pulp debridement, uncomplicated abscess drainage. °Payment Options: °PAYMENT IS DUE AT THE TIME OF SERVICE.  Fee is usually $100-200, additional surgical procedures (e.g. abscess drainage) may be extra. °Cash, checks, Visa/MasterCard accepted.  Can file Medicaid if patient is covered for dental - patient should call case worker to check. °No discount for [facility] patients. °Best way to get seen: °MUST call the day before and get onto the schedule. Can usually be seen the next 1-2 days. No walk-ins accepted. °  °  °[facility] °[phone] °   °Location: °[facility], [address], [place] °Clinic Hours: °M, W, Th, F 8am or 1:30pm, Tues 9a or 1:30 - first come/first served. °Services: °Simple extractions, temporary fillings, uncomplicated abscess drainage.  You do not need to be an [place] resident. °Payment Options: °PAYMENT IS DUE AT THE TIME OF SERVICE. °Dental insurance, otherwise sliding scale - bring proof of income or support. °Depending on income and treatment needed, cost is usually $50-200. °Best way to get seen: °Arrive early as it is first come/first served. °  °  °[facility] °[phone] °  °Location: °[address] °Clinic Hours: °Mon-Thu 8a-5p °Services: °Most basic dental services including extractions and fillings. °Payment Options: °PAYMENT IS DUE AT THE TIME OF SERVICE. °Sliding scale, up to 50% off - bring proof if income or support. °Medicaid with dental option accepted. °Best way to get seen: °Call to schedule an appointment, can usually be seen within 2 weeks OR they will try to see walk-ins - show up at 8a or 2p (you may have to wait). °  °  °[facility] °919-245-2435 °[place] RESIDENTS ONLY °  °Location: °[facility], [address], [place], [place] [address] °Clinic Hours: By appointment only. °Monday - Thursday 8am-5pm, Friday 8am-12pm °Services: Cleanings, fillings, extractions. °Payment Options: °PAYMENT IS DUE AT THE TIME OF SERVICE. °Cash, Visa or MasterCard. Sliding scale - $30 minimum per service. °Best way to get seen: °Come in to office, complete packet and make an appointment - need proof of income °or support monies for each household member and proof of [place] residence. °Usually takes about a month to get in. °  °  °[facility] °[phone] °  °Location: °[address]   [place] °Clinic Hours: Walk-in Urgent Care Dental Services are offered Monday-Friday  mornings only. °The numbers of emergencies accepted daily is limited to the number of °providers available. °Maximum 15 - Mondays, Wednesdays & Thursdays °Maximum 10 - Tuesdays & Fridays °Services: °You do not need to be a [place] resident to be seen for a dental emergency. °Emergencies are defined as pain, swelling, abnormal bleeding, or dental trauma. Walkins will receive x-rays if needed. °NOTE: Dental cleaning is not an emergency. °Payment Options: °PAYMENT IS DUE AT THE TIME OF SERVICE. °Minimum co-pay is $40.00 for uninsured patients. °Minimum co-pay is $3.00 for Medicaid with dental coverage. °Dental Insurance is accepted and must be presented at time of visit. °Medicare does not cover dental. °Forms of payment: Cash, credit card, checks. °Best way to get seen: °If not previously registered with the clinic, walk-in dental registration begins at 7:15 am and is on a first come/first serve basis. °If previously registered with the clinic, call to make an appointment. °  °  °[facility] °919-776-4359 °[place] RESIDENTS ONLY °  °Location: °[address], [place], [place] °Clinic Hours: °Mon-Thu 10a-2p °Services: Extractions only! °Payment Options: °FREE (donations accepted) - bring proof of income or support °Best way to get seen: °Call and schedule an appointment OR come at 8am on the 1st Monday of every month (except for holidays) when it is first come/first served. °  °  °[facility] °[phone] °  °Location: °[address], [place] °Clinic Hours: °Friday mornings °Services, Payment Options, Best way to get seen: °Call for info °

## 2016-01-21 NOTE — ED Provider Notes (Signed)
[facility] Emergency Department Brian Lutz Note  ____________________________________________  Time seen: Approximately 6:54 PM  I have reviewed the triage vital signs and the nursing notes.   HISTORY  Chief Complaint Dental Pain    HPI Brian Lutz is a 41 y.o. male that presents emergency department with lower left dental pain for one day. Patient states he has not eaten much today due to tooth pain. She states that he gets sharp pains with cold drinks or cold air. He is drinking normally. Patient states that he has not been able to see the dentist.Patient has been applying Orajel, without relief. He denies fever, drooling, difficulty swallowing, swelling, SOB, CP, nausea, vomiting.   Past Medical History:  Diagnosis Date  . Asthma   . MRSA (methicillin resistant staph aureus) culture positive     Patient Active Problem List   Diagnosis Date Noted  . Abdominal pain, epigastric   . Abscess of groin, left 02/05/2015  . Nausea 02/05/2015  . Hx MRSA infection 02/05/2015  . Cellulitis 02/05/2015  . Left leg DVT ([place]) 07/27/2014  . Acute embolism and thrombosis of deep vein of left lower extremity ([place]) 07/27/2014  . Cellulitis of left leg 07/24/2014    Past Surgical History:  Procedure Laterality Date  . CHOLECYSTECTOMY    . COLONOSCOPY WITH PROPOFOL N/A 12/02/2015   Procedure: COLONOSCOPY WITH PROPOFOL;  Surgeon: [name], MD;  Location: [facility] ENDOSCOPY;  Service: Endoscopy;  Laterality: N/A;  . ESOPHAGOGASTRODUODENOSCOPY (EGD) WITH PROPOFOL N/A 12/02/2015   Procedure: ESOPHAGOGASTRODUODENOSCOPY (EGD) WITH PROPOFOL;  Surgeon: [name], MD;  Location: [facility] ENDOSCOPY;  Service: Endoscopy;  Laterality: N/A;  . INCISION AND DRAINAGE ABSCESS Left 07/26/2014   Procedure: INCISION AND DRAINAGE LEFT LEG ABSCESS;  Surgeon: Marlyce Huge, MD;  Location: [facility] ORS;  Service: General;  Laterality: Left;    Prior to Admission medications   Medication  Sig Start Date End Date Taking? Authorizing Amijah Timothy  amoxicillin (AMOXIL) 500 MG capsule Take 1 capsule (500 mg total) by mouth 3 (three) times daily. 01/21/16 01/31/16  Laban Emperor, PA-C  metoCLOPramide (REGLAN) 10 MG tablet Take 10 mg by mouth 4 (four) times daily.    Historical Nick Armel, MD  naproxen (NAPROSYN) 500 MG tablet Take 1 tablet (500 mg total) by mouth 2 (two) times daily with a meal. 01/22/16   [name], PA-C  pantoprazole (PROTONIX) 40 MG tablet Take 1 tablet (40 mg total) by mouth daily. 02/08/15   Bettey Costa, MD  sucralfate (CARAFATE) 1 GM/10ML suspension Take 10 mLs (1 g total) by mouth 4 (four) times daily -  with meals and at bedtime. 12/20/15   [name], MD  SUMAtriptan (IMITREX) 100 MG tablet Take 100 mg by mouth every 2 (two) hours as needed for migraine. May repeat in 2 hours if headache persists or recurs.    Historical Khamia Stambaugh, MD  traMADol (ULTRAM) 50 MG tablet Take 1 tablet (50 mg total) by mouth every 6 (six) hours as needed. 01/21/16 01/20/17  Laban Emperor, PA-C    Allergies Patient has no known allergies.  Family History  Problem Relation Age of Onset  . Hypertension Mother   . Asthma Brother     Social History Social History  Substance Use Topics  . Smoking status: Former Research scientist (life sciences)  . Smokeless tobacco: Never Used  . Alcohol use No     Review of Systems  Constitutional: No fever/chills ENT: No upper respiratory complaints. Cardiovascular: No chest pain. Respiratory: No cough. No SOB. Gastrointestinal: No  abdominal pain.  No nausea, no vomiting.  Musculoskeletal: Negative for musculoskeletal pain. Skin: Negative for rash, abrasions, lacerations, ecchymosis. Neurological: Negative for headaches, numbness or tingling   ____________________________________________   PHYSICAL EXAM:  VITAL SIGNS: ED Triage Vitals [01/21/16 1730]  Enc Vitals Group     BP 128/85     Pulse Rate 74     Resp 18     Temp 98.3 F (36.8 C)     Temp Source Oral      SpO2 100 %     Weight 180 lb (81.6 kg)     Height 6\' 1"  (1.854 m)     Head Circumference      Peak Flow      Pain Score 9     Pain Loc      Pain Edu?      Excl. in [place]?      Constitutional: Alert and oriented. Well appearing and in no acute distress. Eyes: Conjunctivae are normal. PERRL. EOMI. Head: Atraumatic. ENT:      Ears:      Nose: No congestion/rhinnorhea.      Mouth/Throat: Mucous membranes are moist. Oropharynx non-erythematous. Tonsils not enlarged. Uvula midline.  Large filling and caries present in tooth #19. Tenderness around tooth. Tooth #18 has been pulled. No fluctuant areas or areas of induration on buccal or mucosal surfaces. No TMJ pain. No trismus.  Neck: No stridor.  No cervical spine tenderness to palpation. Hematological/Lymphatic/Immunilogical: No cervical lymphadenopathy. Cardiovascular: Normal rate, regular rhythm. Normal S1 and S2.  Good peripheral circulation. Respiratory: Normal respiratory effort without tachypnea or retractions. Lungs CTAB. Good air entry to the bases with no decreased or absent breath sounds. Musculoskeletal: Full range of motion to all extremities. No gross deformities appreciated. Neurologic:  Normal speech and language. No gross focal neurologic deficits are appreciated.  Skin:  Skin is warm, dry and intact. No rash noted. Psychiatric: Mood and affect are normal. Speech and behavior are normal. Patient exhibits appropriate insight and judgement.   ____________________________________________   LABS (all labs ordered are listed, but only abnormal results are displayed)  Labs Reviewed - No data to display ____________________________________________  EKG   ____________________________________________  RADIOLOGY  No results found.  ____________________________________________    PROCEDURES  Procedure(s) performed:    Procedures    Medications  HYDROcodone-acetaminophen (NORCO/VICODIN) 5-325 MG per tablet  1 tablet (1 tablet Oral Given 01/21/16 1838)     ____________________________________________   INITIAL IMPRESSION / ASSESSMENT AND PLAN / ED COURSE  Pertinent labs & imaging results that were available during my care of the patient were reviewed by me and considered in my medical decision making (see chart for details).  Review of the [place] CSRS was performed in accordance of the [place] prior to dispensing any controlled drugs.     Patient's diagnosis is consistent with dental caries. Vital signs and exam are reassuring. Patient will be discharged home with prescriptions for tramadol and amoxicillin. Patient is to follow up with dentist as directed. Patient is given ED precautions to return to the ED for any worsening or new symptoms.     ____________________________________________  FINAL CLINICAL IMPRESSION(S) / ED DIAGNOSES  Final diagnoses:  Dental infection      NEW MEDICATIONS STARTED DURING THIS VISIT:  Discharge Medication List as of 01/21/2016  6:22 PM    START taking these medications   Details  amoxicillin (AMOXIL) 500 MG capsule Take 1 capsule (500 mg total) by mouth 3 (three) times daily., Starting  Sat 01/21/2016, Until Tue 01/31/2016, Print    traMADol (ULTRAM) 50 MG tablet Take 1 tablet (50 mg total) by mouth every 6 (six) hours as needed., Starting Sat 01/21/2016, Until Sun 01/20/2017, Print            This chart was dictated using voice recognition software/Dragon. Despite best efforts to proofread, errors can occur which can change the meaning. Any change was purely unintentional.    Laban Emperor, PA-C 01/22/16 [place], MD 01/22/16 [phone]

## 2016-01-22 ENCOUNTER — Emergency Department (HOSPITAL_COMMUNITY)
Admission: EM | Admit: 2016-01-22 | Discharge: 2016-01-22 | Disposition: A | Discharge status: Home / Self Care | Payer: Medicaid Other | Attending: Emergency Medicine | Admitting: Emergency Medicine

## 2016-01-22 ENCOUNTER — Encounter (HOSPITAL_COMMUNITY): Payer: Self-pay

## 2016-01-22 DIAGNOSIS — J45909 Unspecified asthma, uncomplicated: Secondary | ICD-10-CM | POA: Diagnosis not present

## 2016-01-22 DIAGNOSIS — Z87891 Personal history of nicotine dependence: Secondary | ICD-10-CM | POA: Insufficient documentation

## 2016-01-22 DIAGNOSIS — K0889 Other specified disorders of teeth and supporting structures: Secondary | ICD-10-CM

## 2016-01-22 DIAGNOSIS — Z79899 Other long term (current) drug therapy: Secondary | ICD-10-CM | POA: Insufficient documentation

## 2016-01-22 MED ORDER — NAPROXEN 500 MG PO TABS: 500.0000 mg | ORAL_TABLET | Freq: Two times a day (BID) | ORAL | 0 refills | Status: DC

## 2016-01-22 NOTE — ED Notes (Signed)
Declined W/C at D/C and was escorted to lobby by RN. 

## 2016-01-22 NOTE — ED Provider Notes (Signed)
[facility] DEPT Provider Note   CSN: [id number] Arrival date & time: 01/22/16  0745    By signing my name below, I, Brian Lutz, attest that this documentation has been prepared under the direction and in the presence of [name], [place]. Electronically Signed: Macon Lutz, ED Scribe. 01/22/16. 10:12 AM.  History   Chief Complaint Chief Complaint  Patient presents with  . Dental Pain   The history is provided by the patient. No language interpreter was used.   HPI Comments: Brian Lutz is a 41 y.o. male  who presents to the Emergency Department complaining of moderate, constant, left lower tooth pain onset three days ago. He notes being seen at [facility] last night for similar symptoms. Pt was prescribed Tramadol and antibiotics. He notes the tramadol had minimal relief. He would like something else for pain. Pt states he has been unable to see a dentist due to adverse weather. He notes his left tooth pain is exacerbated with jaw movement. Denies discharge from mouth, vomiting, CP, SOB, fever, chills, abdominal pain.  Past Medical History:  Diagnosis Date  . Asthma   . MRSA (methicillin resistant staph aureus) culture positive     Patient Active Problem List   Diagnosis Date Noted  . Abdominal pain, epigastric   . Abscess of groin, left 02/05/2015  . Nausea 02/05/2015  . Hx MRSA infection 02/05/2015  . Cellulitis 02/05/2015  . Left leg DVT ([place]) 07/27/2014  . Acute embolism and thrombosis of deep vein of left lower extremity ([place]) 07/27/2014  . Cellulitis of left leg 07/24/2014    Past Surgical History:  Procedure Laterality Date  . CHOLECYSTECTOMY    . COLONOSCOPY WITH PROPOFOL N/A 12/02/2015   Procedure: COLONOSCOPY WITH PROPOFOL;  Surgeon: [name], MD;  Location: [facility] ENDOSCOPY;  Service: Endoscopy;  Laterality: N/A;  . ESOPHAGOGASTRODUODENOSCOPY (EGD) WITH PROPOFOL N/A 12/02/2015   Procedure: ESOPHAGOGASTRODUODENOSCOPY (EGD) WITH PROPOFOL;  Surgeon:  [name], MD;  Location: [facility] ENDOSCOPY;  Service: Endoscopy;  Laterality: N/A;  . INCISION AND DRAINAGE ABSCESS Left 07/26/2014   Procedure: INCISION AND DRAINAGE LEFT LEG ABSCESS;  Surgeon: Marlyce Huge, MD;  Location: [facility] ORS;  Service: General;  Laterality: Left;       Home Medications    Prior to Admission medications   Medication Sig Start Date End Date Taking? Authorizing Provider  amoxicillin (AMOXIL) 500 MG capsule Take 1 capsule (500 mg total) by mouth 3 (three) times daily. 01/21/16 01/31/16  Laban Emperor, PA-C  metoCLOPramide (REGLAN) 10 MG tablet Take 10 mg by mouth 4 (four) times daily.    Historical Provider, MD  naproxen (NAPROSYN) 500 MG tablet Take 1 tablet (500 mg total) by mouth 2 (two) times daily with a meal. 01/22/16   [name], PA-C  pantoprazole (PROTONIX) 40 MG tablet Take 1 tablet (40 mg total) by mouth daily. 02/08/15   Bettey Costa, MD  sucralfate (CARAFATE) 1 GM/10ML suspension Take 10 mLs (1 g total) by mouth 4 (four) times daily -  with meals and at bedtime. 12/20/15   [name], MD  SUMAtriptan (IMITREX) 100 MG tablet Take 100 mg by mouth every 2 (two) hours as needed for migraine. May repeat in 2 hours if headache persists or recurs.    Historical Provider, MD  traMADol (ULTRAM) 50 MG tablet Take 1 tablet (50 mg total) by mouth every 6 (six) hours as needed. 01/21/16 01/20/17  Laban Emperor, PA-C    Family History Family History  Problem Relation Age of Onset  .  Hypertension Mother   . Asthma Brother     Social History Social History  Substance Use Topics  . Smoking status: Former Research scientist (life sciences)  . Smokeless tobacco: Never Used  . Alcohol use No     Allergies   Patient has no known allergies.   Review of Systems Review of Systems  Constitutional: Negative for chills and fever.  HENT: Positive for dental problem. Negative for drooling, ear pain, facial swelling, sore throat and trouble swallowing.   Eyes: Negative for pain and visual  disturbance.  Respiratory: Negative for shortness of breath.   Cardiovascular: Negative for chest pain.  Gastrointestinal: Negative for abdominal pain and vomiting.  Musculoskeletal: Negative for neck pain and neck stiffness.  Skin: Negative for rash.  Neurological: Negative for headaches.     Physical Exam Updated Vital Signs BP 108/85 (BP Location: Left Arm)   Pulse 75   Temp 97.4 F (36.3 C) (Oral)   Resp 18   Ht 6\' 1"  (1.854 m)   Wt 180 lb (81.6 kg)   SpO2 100%   BMI 23.75 kg/m   Physical Exam  Constitutional: He is oriented to person, place, and time. He appears well-developed and well-nourished. No distress.  Non-toxic appearing.   HENT:  Head: Normocephalic and atraumatic.  Right Ear: External ear normal.  Left Ear: External ear normal.  Mouth/Throat: Oropharynx is clear and moist. No oropharyngeal exudate.  Tenderness to left lower molar. Dental caries present.  No abscess. No discharge from the mouth. No facial swelling. Uvula is midline without edema. Soft palate rises symmetrically. No tonsillar hypertrophy or exudates. Tongue protrusion is normal. No trismus. No PTA.   Eyes: Conjunctivae are normal. Pupils are equal, round, and reactive to light. Right eye exhibits no discharge. Left eye exhibits no discharge.  Neck: Normal range of motion. Neck supple. No JVD present. No tracheal deviation present.  Cardiovascular: Normal rate and intact distal pulses.   Pulmonary/Chest: Effort normal. No respiratory distress.  Lymphadenopathy:    He has no cervical adenopathy.  Neurological: He is alert and oriented to person, place, and time. No cranial nerve deficit. Coordination normal.  Skin: Skin is warm and dry. Capillary refill takes less than 2 seconds. No rash noted. He is not diaphoretic. No erythema. No pallor.  Psychiatric: He has a normal mood and affect. His behavior is normal.  Nursing note and vitals reviewed.    ED Treatments / Results   DIAGNOSTIC  STUDIES: Oxygen Saturation is 100% on RA, normal by my interpretation.    COORDINATION OF CARE: 10:09 AM Discussed treatment plan with pt at bedside which includes antiinflammatory medication and f/u with Dentist and pt agreed to plan.   Labs (all labs ordered are listed, but only abnormal results are displayed) Labs Reviewed - No data to display  EKG  EKG Interpretation None       Radiology No results found.  Procedures Procedures (including critical care time)  Medications Ordered in ED Medications - No data to display   Initial Impression / Assessment and Plan / ED Course  I have reviewed the triage vital signs and the nursing notes.  Pertinent labs & imaging results that were available during my care of the patient were reviewed by me and considered in my medical decision making (see chart for details).   This  is a 41 y.o. male  who presents to the Emergency Department complaining of moderate, constant, left lower tooth pain onset three days ago. He notes being seen at  [place] last night for similar symptoms. Pt was prescribed Tramadol and antibiotics. He notes the tramadol had minimal relief. He would like something else for pain. Patient with toothache.  No gross abscess.  Exam unconcerning for Ludwig's angina or spread of infection.  I advised patient he can use naproxen for pain control. I provided him with follow-up with dentist Dr. Radford Pax. I advised him to take his discharge instructions to his appointment. I discussed return precautions. I advised the patient to follow-up with their primary care provider this week. I advised the patient to return to the emergency department with new or worsening symptoms or new concerns. The patient verbalized understanding and agreement with plan.    Final Clinical Impressions(s) / ED Diagnoses   Final diagnoses:  Pain, dental    New Prescriptions New Prescriptions   NAPROXEN (NAPROSYN) 500 MG TABLET    Take 1 tablet (500 mg  total) by mouth 2 (two) times daily with a meal.   I personally performed the services described in this documentation, which was scribed in my presence. The recorded information has been reviewed and is accurate.         [name], PA-C 01/22/16 [place], MD 01/22/16 [phone]

## 2016-01-22 NOTE — ED Triage Notes (Signed)
Pt. Was seen at [place] last night and was given Tramadol and antibiotics  The Tramadol is not helping.  Pt. Needs stronger pain medication until he gets a dental appointment.  Unable to work or sleep. Would also like a nerve block if possible

## 2016-03-12 ENCOUNTER — Other Ambulatory Visit
Admission: RE | Admit: 2016-03-12 | Discharge: 2016-03-12 | Disposition: A | Discharge status: Home / Self Care | Payer: Medicaid Other | Source: Ambulatory Visit | Attending: Gastroenterology | Admitting: Gastroenterology

## 2016-03-12 DIAGNOSIS — R197 Diarrhea, unspecified: Secondary | ICD-10-CM | POA: Insufficient documentation

## 2016-03-12 DIAGNOSIS — R1013 Epigastric pain: Secondary | ICD-10-CM | POA: Diagnosis present

## 2016-03-12 LAB — C DIFFICILE QUICK SCREEN W PCR REFLEX
C DIFFICLE (CDIFF) ANTIGEN: POSITIVE — AB
C Diff toxin: NEGATIVE

## 2016-03-12 LAB — CLOSTRIDIUM DIFFICILE BY PCR: Toxigenic C. Difficile by PCR: NEGATIVE

## 2016-03-13 LAB — DRUG PROFILE, UR, 9 DRUGS (LABCORP)
Amphetamines, Urine: NEGATIVE ng/mL
BARBITURATE, UR: NEGATIVE ng/mL
BENZODIAZEPINE QUANT UR: NEGATIVE ng/mL
CANNABINOID QUANT UR: NEGATIVE ng/mL
COCAINE (METAB.): NEGATIVE ng/mL
Methadone Screen, Urine: NEGATIVE ng/mL
OPIATE QUANT UR: NEGATIVE ng/mL
Phencyclidine, Ur: NEGATIVE ng/mL
Propoxyphene, Urine: NEGATIVE ng/mL

## 2016-03-14 ENCOUNTER — Telehealth: Payer: Self-pay

## 2016-03-14 LAB — H. PYLORI ANTIGEN, STOOL: H. Pylori Stool Ag, Eia: NEGATIVE

## 2016-03-14 NOTE — Telephone Encounter (Signed)
-----   Message from [name], MD sent at 03/13/2016  8:52 AM EDT ----- 1. Stool for cdiff is positive 2. Not sure who ordered it- he hasnt been seen since 3 months 3, Enquire if he is having diarrhea and if he has will need a course of antibiotics

## 2016-03-14 NOTE — Telephone Encounter (Signed)
Spoke with patients family member. Advised callback. Left phone number.

## 2016-03-15 ENCOUNTER — Telehealth: Payer: Self-pay

## 2016-03-15 ENCOUNTER — Other Ambulatory Visit: Payer: Self-pay

## 2016-03-15 DIAGNOSIS — A049 Bacterial intestinal infection, unspecified: Secondary | ICD-10-CM

## 2016-03-15 MED ORDER — VANCOMYCIN HCL 125 MG PO CAPS: 125.0000 mg | ORAL_CAPSULE | Freq: Four times a day (QID) | ORAL | 0 refills | Status: AC

## 2016-03-15 MED ORDER — VANCOMYCIN 50 MG/ML ORAL SOLUTION: 125.0000 mg | Freq: Four times a day (QID) | ORAL | Status: DC

## 2016-03-15 NOTE — Telephone Encounter (Signed)
Spoke with patient directly.  Advised him to take medication every 6 hours as prescribed.   Pt stated he didn't have faith in the process but he would try it.

## 2016-03-15 NOTE — Telephone Encounter (Signed)
-----   Message from [name], MD sent at 03/15/2016  7:32 AM EDT ----- Regarding: RE: Diarrhea and Emesis Commence on oral vancomycin 125 mg 4 times a day for 10 days, lets have him come and see me after that   Charles Mix  ----- Message ----- From: Leontine Locket, CMA Sent: 03/14/2016   4:48 PM To: [name], MD Subject: Diarrhea and Emesis                            Dr. Vicente Males,   The patient states he's experiencing diarrhea with mucus-like substance. Unable to digest solid food. And episodes of emesis. Please advise.   ----- Message ----- From: [name], MD Sent: 03/13/2016   8:52 AM To: Leontine Locket, CMA  1. Stool for cdiff is positive 2. Not sure who ordered it- he hasnt been seen since 3 months 3, Enquire if he is having diarrhea and if he has will need a course of antibiotics

## 2016-03-28 ENCOUNTER — Ambulatory Visit (INDEPENDENT_AMBULATORY_CARE_PROVIDER_SITE_OTHER): Payer: Medicaid Other | Admitting: Gastroenterology

## 2016-03-28 ENCOUNTER — Encounter: Payer: Self-pay | Admitting: Gastroenterology

## 2016-03-28 VITALS — BP 103/71 | HR 82 | Temp 97.4°F | Ht 73.0 in | Wt 178.0 lb

## 2016-03-28 DIAGNOSIS — R1013 Epigastric pain: Secondary | ICD-10-CM | POA: Diagnosis not present

## 2016-03-28 MED ORDER — AMITRIPTYLINE HCL 10 MG PO TABS
10.0000 mg | ORAL_TABLET | Freq: Every day | ORAL | 3 refills | Status: AC
Start: 2016-03-28 — End: 2016-06-28

## 2016-03-28 NOTE — Progress Notes (Signed)
Primary Care Physician: No PCP Per Patient  Primary Gastroenterologist:  Dr. [name]   No chief complaint on file.   HPI: Brian Lutz is a 41 y.o. male  He is here for follow up . He was last seen in 12/2015 for abdominal pain, nausea,bloating   Summary of history :  Last CT abdomen 07/2015 was normal  EGD was normal with normal duodenal biopsies   At some point after his visit I receoved a stool test result where he tested positive for C diff which I had not ordered, since he had diarrhea we proceeded to treat him with vancomycin. On 03/14/16 .   I have treated him in the past for SIBO with Augmentin .   Says he took  1-2 tablets of vancomycin and is still taking it despite having only a 10 day prescription.  Presently has 2-3 bowel movements - consistency of mashed potatoes.   He kept saying that he feels something else is going on . His main issues is long standing generalized abdominal pain for 5 years duration , he claims to not keep any food down.   He denies smoking but has a strong smell of THC around him.He says he does not smoke but his mother smokes cigarettes   Current Outpatient Prescriptions  Medication Sig Dispense Refill  . metoCLOPramide (REGLAN) 10 MG tablet Take 10 mg by mouth 4 (four) times daily.    . naproxen (NAPROSYN) 500 MG tablet Take 1 tablet (500 mg total) by mouth 2 (two) times daily with a meal. 30 tablet 0  . pantoprazole (PROTONIX) 40 MG tablet Take 1 tablet (40 mg total) by mouth daily. 30 tablet 0  . sucralfate (CARAFATE) 1 GM/10ML suspension Take 10 mLs (1 g total) by mouth 4 (four) times daily -  with meals and at bedtime. 420 mL 0  . SUMAtriptan (IMITREX) 100 MG tablet Take 100 mg by mouth every 2 (two) hours as needed for migraine. May repeat in 2 hours if headache persists or recurs.    . traMADol (ULTRAM) 50 MG tablet Take 1 tablet (50 mg total) by mouth every 6 (six) hours as needed. 8 tablet 0   No current facility-administered  medications for this visit.     Allergies as of 03/28/2016  . (No Known Allergies)    ROS:  General: Negative for anorexia, weight loss, fever, chills, fatigue, weakness. ENT: Negative for hoarseness, difficulty swallowing , nasal congestion. CV: Negative for chest pain, angina, palpitations, dyspnea on exertion, peripheral edema.  Respiratory: Negative for dyspnea at rest, dyspnea on exertion, cough, sputum, wheezing.  GI: See history of present illness. GU:  Negative for dysuria, hematuria, urinary incontinence, urinary frequency, nocturnal urination.  Endo: Negative for unusual weight change.    Physical Examination:   There were no vitals taken for this visit.  General: Well-nourished, well-developed in no acute distress.  Eyes: No icterus. Conjunctivae pink. Mouth: Oropharyngeal mucosa moist and pink , no lesions erythema or exudate. Lungs: Clear to auscultation bilaterally. Non-labored. Heart: Regular rate and rhythm, no murmurs rubs or gallops.  Abdomen: Bowel sounds are normal, nontender, nondistended, no hepatosplenomegaly or masses, no abdominal bruits or hernia , no rebound or guarding.   Extremities: No lower extremity edema. No clubbing or deformities. Neuro: Alert and oriented x 3.  Grossly intact. Skin: Warm and dry, no jaundice.   Psych: Alert and cooperative, normal mood and affect.   Imaging Studies: No results found.  Assessment and Plan:  Brian Lutz is a 41 y.o. y/o male here to follow up for abdominal pain , c diff diarrhea .   Plan  1. Complete vancomycin previously provided 2. 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  3. 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  4. Stop exposure to passive cigarette smoking .    Dr [name]  MD Follow up in 6 weeks`

## 2016-05-10 ENCOUNTER — Encounter (INDEPENDENT_AMBULATORY_CARE_PROVIDER_SITE_OTHER): Payer: Self-pay

## 2016-05-10 ENCOUNTER — Telehealth: Payer: Self-pay

## 2016-05-10 ENCOUNTER — Ambulatory Visit: Payer: Medicaid Other | Admitting: Gastroenterology

## 2016-05-10 ENCOUNTER — Ambulatory Visit (INDEPENDENT_AMBULATORY_CARE_PROVIDER_SITE_OTHER): Payer: Medicaid Other | Admitting: Gastroenterology

## 2016-05-10 ENCOUNTER — Encounter: Payer: Self-pay | Admitting: Gastroenterology

## 2016-05-10 VITALS — BP 122/85 | HR 74 | Temp 97.8°F | Ht 73.0 in | Wt 177.2 lb

## 2016-05-10 DIAGNOSIS — K30 Functional dyspepsia: Secondary | ICD-10-CM

## 2016-05-10 NOTE — Progress Notes (Signed)
Primary Care Physician: Patient, No Pcp Per  Primary Gastroenterologist:  Dr. [name]   No chief complaint on file.   HPI: Brian Lutz is a 41 y.o. male   Summary of history : His initial visit on 11/30/15 for diarrhea,abdominal pain for 3-4 years duration .He has undergone a cholecystectomy . Last CT abdomen 07/2015 was normal . He also mentioned weight loss of 15 lbs.  At some point after his visit I received a stool test result where he tested positive for C diff which I had not ordered, since he had diarrhea we proceeded to treat him with vancomycin. On 03/14/16 .   EGD was normal with normal duodenal biopsies .  On his colonoscopy , excised a small polyp which was a tubular adenoma and suggested a recall of 5 years . Biopsies of his terminal ileum and random colon biopsies were normal,   Interval history   03/28/2016-  05/10/2016   No weight loss since last visit .  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 Completed the course of vancomycin .  He says he didn't take the amitrypatline - he says it irritated his stomach .  Has not tried any charcoal tablets.  Stopped taking his PPI as it didn't help me any more.  We ordered a gastric emptying studying .   BP 122/85 (BP Location: Left Arm, Patient Position: Sitting, Cuff Size: Normal)   Pulse 74   Temp 97.8 F (36.6 C) (Oral)   Ht 6\' 1"  (1.854 m)   Wt 177 lb 3.2 oz (80.4 kg)   BMI 23.38 kg/m    Current Outpatient Prescriptions  Medication Sig Dispense Refill  . amitriptyline (ELAVIL) 10 MG tablet Take 1 tablet (10 mg total) by mouth at bedtime. 30 tablet 3  . metoCLOPramide (REGLAN) 10 MG tablet Take 10 mg by mouth 4 (four) times daily.    . pantoprazole (PROTONIX) 40 MG tablet Take 1 tablet (40 mg total) by mouth daily. 30 tablet 0  . sucralfate (CARAFATE) 1 GM/10ML suspension Take 10 mLs (1 g  total) by mouth 4 (four) times daily -  with meals and at bedtime. (Patient not taking: Reported on 03/28/2016) 420 mL 0  . SUMAtriptan (IMITREX) 100 MG tablet Take 100 mg by mouth every 2 (two) hours as needed for migraine. May repeat in 2 hours if headache persists or recurs.     No current facility-administered medications for this visit.     Allergies as of 05/10/2016  . (No Known Allergies)    ROS:  General: Negative for anorexia, weight loss, fever, chills, fatigue, weakness. ENT: Negative for hoarseness, difficulty swallowing , nasal congestion. CV: Negative for chest pain, angina, palpitations, dyspnea on exertion, peripheral edema.  Respiratory: Negative for dyspnea at rest, dyspnea on exertion, cough, sputum, wheezing.  GI: See history of present illness. GU:  Negative for dysuria, hematuria, urinary incontinence, urinary frequency, nocturnal urination.  Endo: Negative for unusual weight change.    Physical Examination:   There were no vitals taken for this visit.  General: Well-nourished, well-developed in no acute distress.  Eyes: No icterus. Conjunctivae pink. Mouth: Oropharyngeal mucosa moist and pink , no lesions erythema or exudate. Lungs: Clear to auscultation bilaterally. Non-labored. Heart: Regular rate and rhythm, no murmurs rubs or gallops.  Abdomen: Bowel sounds are normal, nontender, nondistended, no hepatosplenomegaly or masses, no abdominal  bruits or hernia , no rebound or guarding.   Extremities: No lower extremity edema. No clubbing or deformities. Neuro: Alert and oriented x 3.  Grossly intact. Skin: Warm and dry, no jaundice.   Psych: Alert and cooperative, normal mood and affect.   Imaging Studies: No results found.  Assessment and Plan:   Brian Lutz is a 41 y.o. y/o male  here for follow up . His main symptoms are nausea, bloating, dyspepsia  . EGD was normal in addition to CT abdomen . Colonoscopy showed a small tubular adenoma . Attempted  to treat him with doxycyline for SIBO which he didn't tolerate, didn't tolerate Elavil, Will evaluate for gastroparesis and trial of IB guard. If no better will send to [facility] for a second opinion.   Plan   1. Gastric emptying studying  2. Charcoal tablets over the counter`   Dr [name]  MD Follow up in 2 months

## 2016-05-10 NOTE — Telephone Encounter (Signed)
Patient's mother answered the phone.  Took phone number for patient callback.   Advise gastric emptying study has been scheduled for 5/21 @ 715am @ [place].  No PPI's for 8hrs NPO after midnight

## 2016-05-21 ENCOUNTER — Ambulatory Visit: Payer: Medicaid Other

## 2016-05-22 ENCOUNTER — Telehealth: Payer: Self-pay

## 2016-05-22 NOTE — Telephone Encounter (Signed)
Patient missed previous gastric emptying study.   Rescheduled: June 1st @ 945 @ [facility]  No Protonix or Carafate for 8hours NPO after midnight.  Called and advised patient.   Mailed reminder letter with information as well.

## 2016-06-01 ENCOUNTER — Ambulatory Visit
Admission: RE | Admit: 2016-06-01 | Discharge: 2016-06-01 | Disposition: A | Discharge status: Home / Self Care | Payer: Medicaid Other | Source: Ambulatory Visit | Attending: Gastroenterology | Admitting: Gastroenterology

## 2016-06-01 DIAGNOSIS — R1013 Epigastric pain: Secondary | ICD-10-CM | POA: Insufficient documentation

## 2016-06-01 MED ORDER — TECHNETIUM TC 99M SULFUR COLLOID FILTERED
2.0000 | Freq: Once | INTRAVENOUS | Status: AC | PRN
  Administered 2016-06-01: 2.24 via INTRADERMAL

## 2016-06-14 ENCOUNTER — Telehealth: Payer: Self-pay

## 2016-06-14 NOTE — Telephone Encounter (Signed)
-----   Message from [name], MD sent at 06/06/2016  2:06 PM EDT ----- Inform normal study- enquire how he is doing - if still having pain refer to [facility] or [facility]- inform am unable to do anything further. I have tried everything am aware of

## 2016-06-14 NOTE — Telephone Encounter (Signed)
-----   Message from [name], MD sent at 06/06/2016  2:06 PM EDT ----- Inform normal study- enquire how he is doing - if still having pain refer to [facility] or [facility]- inform am unable to do anything further. I have tried everything am aware of

## 2016-06-14 NOTE — Telephone Encounter (Signed)
Unable to contact patient. No VM setup.   Mailed letter explaining Dr. Georgeann Oppenheim result note.  Inform normal study- enquire how he is doing - if still having pain refer to [facility] or [facility]- inform am unable to do anything further. I have tried everything am aware of

## 2016-06-19 ENCOUNTER — Telehealth: Payer: Self-pay

## 2016-06-19 NOTE — Telephone Encounter (Signed)
Advised patient of results per Dr. Vicente Males.  Dr. Vicente Males recommended a 2nd opinion from [address] or [place].  Pt requests [facility]. Faxed referral.

## 2016-07-09 ENCOUNTER — Encounter: Payer: Self-pay | Admitting: Gastroenterology

## 2016-07-09 ENCOUNTER — Ambulatory Visit: Payer: Medicaid Other | Admitting: Gastroenterology

## 2016-09-12 ENCOUNTER — Telehealth: Payer: Self-pay

## 2016-09-12 NOTE — Telephone Encounter (Signed)
Patient wants [facility] referral reinstated for appointment.  Updated phone number.   [facility] and updated information. [facility] to call patient directly for appointment.

## 2016-09-25 ENCOUNTER — Encounter: Payer: Self-pay | Admitting: Medical Oncology

## 2016-09-25 ENCOUNTER — Emergency Department
Admission: EM | Admit: 2016-09-25 | Discharge: 2016-09-25 | Disposition: A | Discharge status: Home / Self Care | Payer: Medicaid Other | Attending: Emergency Medicine | Admitting: Emergency Medicine

## 2016-09-25 DIAGNOSIS — J45909 Unspecified asthma, uncomplicated: Secondary | ICD-10-CM | POA: Diagnosis not present

## 2016-09-25 DIAGNOSIS — K920 Hematemesis: Secondary | ICD-10-CM | POA: Insufficient documentation

## 2016-09-25 DIAGNOSIS — Z87891 Personal history of nicotine dependence: Secondary | ICD-10-CM | POA: Insufficient documentation

## 2016-09-25 DIAGNOSIS — R1013 Epigastric pain: Secondary | ICD-10-CM

## 2016-09-25 DIAGNOSIS — T7840XA Allergy, unspecified, initial encounter: Secondary | ICD-10-CM | POA: Diagnosis not present

## 2016-09-25 DIAGNOSIS — R21 Rash and other nonspecific skin eruption: Secondary | ICD-10-CM | POA: Diagnosis not present

## 2016-09-25 DIAGNOSIS — Z79899 Other long term (current) drug therapy: Secondary | ICD-10-CM | POA: Diagnosis not present

## 2016-09-25 LAB — COMPREHENSIVE METABOLIC PANEL
ALK PHOS: 69 U/L (ref 38–126)
ALT: 42 U/L (ref 17–63)
ANION GAP: 8 (ref 5–15)
AST: 37 U/L (ref 15–41)
Albumin: 4.3 g/dL (ref 3.5–5.0)
BUN: 7 mg/dL (ref 6–20)
CO2: 29 mmol/L (ref 22–32)
Calcium: 9.3 mg/dL (ref 8.9–10.3)
Chloride: 103 mmol/L (ref 101–111)
Creatinine, Ser: 0.92 mg/dL (ref 0.61–1.24)
GLUCOSE: 98 mg/dL (ref 65–99)
Potassium: 4.1 mmol/L (ref 3.5–5.1)
SODIUM: 140 mmol/L (ref 135–145)
TOTAL PROTEIN: 7.4 g/dL (ref 6.5–8.1)
Total Bilirubin: 0.8 mg/dL (ref 0.3–1.2)

## 2016-09-25 LAB — CBC
HCT: 44.6 % (ref 40.0–52.0)
Hemoglobin: 15.8 g/dL (ref 13.0–18.0)
MCH: 31.2 pg (ref 26.0–34.0)
MCHC: 35.5 g/dL (ref 32.0–36.0)
MCV: 87.9 fL (ref 80.0–100.0)
PLATELETS: 230 10*3/uL (ref 150–440)
RBC: 5.08 MIL/uL (ref 4.40–5.90)
RDW: 13.3 % (ref 11.5–14.5)
WBC: 8 10*3/uL (ref 3.8–10.6)

## 2016-09-25 LAB — LIPASE, BLOOD: Lipase: 19 U/L (ref 11–51)

## 2016-09-25 MED ORDER — SODIUM CHLORIDE 0.9 % IV BOLUS (SEPSIS)
1000.0000 mL | Freq: Once | INTRAVENOUS | Status: AC
  Administered 2016-09-25: 1000 mL via INTRAVENOUS

## 2016-09-25 MED ORDER — GI COCKTAIL ~~LOC~~
30.0000 mL | Freq: Once | ORAL | Status: AC
  Administered 2016-09-25: 30 mL via ORAL
  Filled 2016-09-25: qty 30

## 2016-09-25 MED ORDER — PREDNISONE 20 MG PO TABS: 40.0000 mg | ORAL_TABLET | Freq: Every day | ORAL | 0 refills | Status: DC

## 2016-09-25 MED ORDER — DIPHENHYDRAMINE HCL 50 MG/ML IJ SOLN
50.0000 mg | Freq: Once | INTRAMUSCULAR | Status: AC
  Administered 2016-09-25: 50 mg via INTRAVENOUS
  Filled 2016-09-25: qty 1

## 2016-09-25 MED ORDER — METHYLPREDNISOLONE SODIUM SUCC 125 MG IJ SOLR
125.0000 mg | Freq: Once | INTRAMUSCULAR | Status: AC
  Administered 2016-09-25: 125 mg via INTRAVENOUS
  Filled 2016-09-25: qty 2

## 2016-09-25 MED ORDER — PANTOPRAZOLE SODIUM 40 MG PO TBEC: 40.0000 mg | DELAYED_RELEASE_TABLET | Freq: Every day | ORAL | 1 refills | Status: DC

## 2016-09-25 NOTE — ED Triage Notes (Signed)
Pt reports rash all over that is itchy x 1 week. Multiple complaints. Pt reports generalized body aches and abd pain x 7 years.

## 2016-09-25 NOTE — ED Provider Notes (Signed)
[facility] Emergency Department Provider Note  Time seen: 12:22 PM  I have reviewed the triage vital signs and the nursing notes.   HISTORY  Chief Complaint Rash    HPI Brian Lutz is a 41 y.o. male With a past medical history of asthma, chronic abdominal pain, presents to the emergency department with multiple complaints. According to the patient over the past one week he has developed a significant rash over all extremities abdomen and back. States the rash is very itchy. States this has happened multiple times in the past and usually improves of Benadryl but it has not improved this time. Patient also states of the past 7 years she has been experiencing intermittent upper abdominal pain nausea vomiting with occasional bloody vomitus. Patient has been seen by GI medicine in the past including earlier this year per patient with an endoscopy but they are unable to find a cause for the patient's symptoms. Patient states last week he vomited more blood than usual but also concerns and although denies any further hematemesis this week. Denies any fever, chest pain or trouble breathing. Describes his upper abdominal pain as moderate and bloating in quality.  Past Medical History:  Diagnosis Date  . Asthma   . MRSA (methicillin resistant staph aureus) culture positive     Patient Active Problem List   Diagnosis Date Noted  . Abdominal pain, epigastric   . Abscess of groin, left 02/05/2015  . Nausea 02/05/2015  . Hx MRSA infection 02/05/2015  . Cellulitis 02/05/2015  . Left leg DVT ([place]) 07/27/2014  . Acute embolism and thrombosis of deep vein of left lower extremity ([place]) 07/27/2014  . Cellulitis of left leg 07/24/2014    Past Surgical History:  Procedure Laterality Date  . CHOLECYSTECTOMY    . COLONOSCOPY WITH PROPOFOL N/A 12/02/2015   Procedure: COLONOSCOPY WITH PROPOFOL;  Surgeon: [name], MD;  Location: [facility] ENDOSCOPY;  Service: Endoscopy;   Laterality: N/A;  . ESOPHAGOGASTRODUODENOSCOPY (EGD) WITH PROPOFOL N/A 12/02/2015   Procedure: ESOPHAGOGASTRODUODENOSCOPY (EGD) WITH PROPOFOL;  Surgeon: [name], MD;  Location: [facility] ENDOSCOPY;  Service: Endoscopy;  Laterality: N/A;  . INCISION AND DRAINAGE ABSCESS Left 07/26/2014   Procedure: INCISION AND DRAINAGE LEFT LEG ABSCESS;  Surgeon: Marlyce Huge, MD;  Location: [facility] ORS;  Service: General;  Laterality: Left;    Prior to Admission medications   Medication Sig Start Date End Date Taking? Authorizing Provider  amitriptyline (ELAVIL) 10 MG tablet Take 1 tablet (10 mg total) by mouth at bedtime. 03/28/16 06/28/16  [name], MD  metoCLOPramide (REGLAN) 10 MG tablet Take 10 mg by mouth 4 (four) times daily.    [provider]  pantoprazole (PROTONIX) 40 MG tablet Take 1 tablet (40 mg total) by mouth daily. 02/08/15   Bettey Costa, MD  sucralfate (CARAFATE) 1 GM/10ML suspension Take 10 mLs (1 g total) by mouth 4 (four) times daily -  with meals and at bedtime. 12/20/15   [name], MD  SUMAtriptan (IMITREX) 100 MG tablet Take 100 mg by mouth every 2 (two) hours as needed for migraine. May repeat in 2 hours if headache persists or recurs.    [provider]    No Known Allergies  Family History  Problem Relation Age of Onset  . Hypertension Mother   . Asthma Brother     Social History Social History  Substance Use Topics  . Smoking status: Former Research scientist (life sciences)  . Smokeless tobacco: Never Used  . Alcohol use No  Review of Systems Constitutional: Negative for fever Cardiovascular: Negative for chest pain. Respiratory: Negative for shortness of breath. Gastrointestinal: upper abdominal discomfort. 7 years. Positive for nausea and vomiting. Occasional bloody vomit. Negative for black or bloody stool. Genitourinary: Negative for dysuria. Neurological: Negative for headache All other ROS negative  ____________________________________________   PHYSICAL  EXAM:  VITAL SIGNS: ED Triage Vitals  Enc Vitals Group     BP 09/25/16 1030 116/86     Pulse Rate 09/25/16 1030 (!) 102     Resp 09/25/16 1030 18     Temp 09/25/16 1030 97.6 F (36.4 C)     Temp Source 09/25/16 1030 Oral     SpO2 09/25/16 1030 100 %     Weight 09/25/16 1030 178 lb (80.7 kg)     Height 09/25/16 1030 6\' 1"  (1.854 m)     Head Circumference --      Peak Flow --      Pain Score 09/25/16 1029 7     Pain Loc --      Pain Edu? --      Excl. in [place]? --     Constitutional: Alert and oriented. Well appearing and in no distress. Eyes: Normal exam ENT   Head: Normocephalic and atraumatic   Mouth/Throat: Mucous membranes are moist. Cardiovascular: Normal rate, regular rhythm. No murmur Respiratory: Normal respiratory effort without tachypnea nor retractions. Breath sounds are clear  Gastrointestinal: soft, mild epigastric tenderness palpation, no rebound or guarding. No distention, abdomen otherwise benign. Musculoskeletal: Nontender with normal range of motion in all extremities. Neurologic:  Normal speech and language. No gross focal neurologic deficits  Skin:  Skin is warm, dry. Patient does have fairly diffuse maculopapular rash over extremities abdomen and lower back. States it is very itchy. Psychiatric: Mood and affect are normal.  ____________________________________________    INITIAL IMPRESSION / ASSESSMENT AND PLAN / ED COURSE  Pertinent labs & imaging results that were available during my care of the patient were reviewed by me and considered in my medical decision making (see chart for details).  patient presents to the emergency department for multiple complaints. States he has been out of work for the past 3 weeks due to these complaints. His main complaint today is a rash 1 week over his extremities chest abdomen and back. States this has happened multiple times in the past improved with Benadryl. Rash is possibly consistent with allergic reaction,  with some overlying excoriation. Patient is actively itching the rash during my exam. No obvious sign of scabies. Patient also presents for upper abdominal pain 7 years with intermittent bloating intermittent nausea and vomiting occasionally with bloody vomitus including last week. Differential for the rash would include allergic reaction, scabies, bites. Differential for the patient's abdominal pain would include such things as gastritis, gastroparesis, less likely varices or ulcer disease given her recent GI endoscopy with negative results. We will check labs including lipase and LFTs. We will treat with a GI cocktail. We will also treat the rash was Solu-Medrol Benadryl and IV fluids. Patient agreeable plan.  rashes improved but still mildly present. We will discharge with prednisone. Given the patient's upper abdominal complaints for the past several years a discussed with the patient and need follow-up with GI medicine for a second opinion. He is agreeable. We will also place the patient on Protonix. I discussed my normal abdominal pain return precautions.  ____________________________________________   FINAL CLINICAL IMPRESSION(S) / ED DIAGNOSES  upper abdominal pain Hematemesis Allergic reaction  Harvest Dark, MD 09/25/16 1400

## 2016-09-25 NOTE — ED Notes (Signed)
Pt reports rash all over his body for 1 week. Pt reports benadryl not helping. Pt also reports has been vommitting blood intermittently for a few weeks. Pt c/o abdominal pain for 7 years but states that noone can figure out why. Pt reports that he has had his gall bladder removed. Pt reports has not been able to work in 3 weeks due to abdominal pain.  Pt c/o rash to full body. Pt reports areas itch but nothing is helping it.

## 2016-10-15 ENCOUNTER — Telehealth: Payer: Self-pay | Admitting: Gastroenterology

## 2016-10-15 NOTE — Telephone Encounter (Signed)
Patient called complaining of severe stomach pain and his stomach is swelling. Please call patient.

## 2017-12-04 ENCOUNTER — Emergency Department
Admission: EM | Admit: 2017-12-04 | Discharge: 2017-12-04 | Disposition: A | Discharge status: Home / Self Care | Payer: Medicaid Other | Attending: Emergency Medicine | Admitting: Emergency Medicine

## 2017-12-04 ENCOUNTER — Other Ambulatory Visit: Payer: Self-pay

## 2017-12-04 DIAGNOSIS — R109 Unspecified abdominal pain: Secondary | ICD-10-CM | POA: Insufficient documentation

## 2017-12-04 DIAGNOSIS — B372 Candidiasis of skin and nail: Secondary | ICD-10-CM | POA: Insufficient documentation

## 2017-12-04 DIAGNOSIS — R197 Diarrhea, unspecified: Secondary | ICD-10-CM

## 2017-12-04 DIAGNOSIS — J45909 Unspecified asthma, uncomplicated: Secondary | ICD-10-CM | POA: Insufficient documentation

## 2017-12-04 DIAGNOSIS — Z87891 Personal history of nicotine dependence: Secondary | ICD-10-CM | POA: Insufficient documentation

## 2017-12-04 DIAGNOSIS — Z79899 Other long term (current) drug therapy: Secondary | ICD-10-CM | POA: Insufficient documentation

## 2017-12-04 MED ORDER — METRONIDAZOLE 500 MG PO TABS: 500.0000 mg | ORAL_TABLET | Freq: Two times a day (BID) | ORAL | 0 refills | Status: DC

## 2017-12-04 MED ORDER — ONDANSETRON 4 MG PO TBDP: 4.0000 mg | ORAL_TABLET | Freq: Three times a day (TID) | ORAL | 0 refills | Status: DC | PRN

## 2017-12-04 MED ORDER — FLUCONAZOLE 150 MG PO TABS: 150.0000 mg | ORAL_TABLET | ORAL | 0 refills | Status: DC

## 2017-12-04 MED ORDER — DICYCLOMINE HCL 10 MG PO CAPS: 10.0000 mg | ORAL_CAPSULE | Freq: Four times a day (QID) | ORAL | 1 refills | Status: DC

## 2017-12-04 NOTE — ED Provider Notes (Signed)
[facility] Emergency Department Provider Note  ____________________________________________  Time seen: Approximately 4:54 PM  I have reviewed the triage vital signs and the nursing notes.   HISTORY  Chief Complaint Diarrhea and Abdominal Pain    HPI Brian Lutz is a 42 y.o. male who presents the emergency department complaining of abdominal cramping, diarrhea, rash to the groin.  Patient has been seen in this department multiple times for nonspecific abdominal pain.  Patient reports that he had colitis symptoms, all of his GI symptoms worsen.  While he was here, patient noted a rash in his groin evaluated as well.  Patient has had extensive work-ups from GI with no definitive diagnosis.  Patient reports abdominal cramping, mucus-like diarrhea.  Patient denies any fevers or chills, emesis, frank pain at this time.  Patient has been on multiple different medications for these complaints without relief.  Patient is also endorsing an erythematous rash to bilateral groin.  Patient reports that he puts a topical medicine on it which helps somewhat but the rash will immediately return.  No other complaints at this time.  Patient denies any headache, chest pain, shortness of breath, fevers or chills.  Patient has a history of ongoing abdominal pain and abdominal complaints.  He also has a history of MRSA, cellulitis, DVT.    Past Medical History:  Diagnosis Date  . Asthma   . MRSA (methicillin resistant staph aureus) culture positive     Patient Active Problem List   Diagnosis Date Noted  . Abdominal pain, epigastric   . Abscess of groin, left 02/05/2015  . Nausea 02/05/2015  . Hx MRSA infection 02/05/2015  . Cellulitis 02/05/2015  . Left leg DVT ([place]) 07/27/2014  . Acute embolism and thrombosis of deep vein of left lower extremity ([place]) 07/27/2014  . Cellulitis of left leg 07/24/2014    Past Surgical History:  Procedure Laterality Date  . CHOLECYSTECTOMY     . COLONOSCOPY WITH PROPOFOL N/A 12/02/2015   Procedure: COLONOSCOPY WITH PROPOFOL;  Surgeon: [name], MD;  Location: [facility] ENDOSCOPY;  Service: Endoscopy;  Laterality: N/A;  . ESOPHAGOGASTRODUODENOSCOPY (EGD) WITH PROPOFOL N/A 12/02/2015   Procedure: ESOPHAGOGASTRODUODENOSCOPY (EGD) WITH PROPOFOL;  Surgeon: [name], MD;  Location: [facility] ENDOSCOPY;  Service: Endoscopy;  Laterality: N/A;  . INCISION AND DRAINAGE ABSCESS Left 07/26/2014   Procedure: INCISION AND DRAINAGE LEFT LEG ABSCESS;  Surgeon: Marlyce Huge, MD;  Location: [facility] ORS;  Service: General;  Laterality: Left;    Prior to Admission medications   Medication Sig Start Date End Date Taking? Authorizing Provider  amitriptyline (ELAVIL) 10 MG tablet Take 1 tablet (10 mg total) by mouth at bedtime. 03/28/16 06/28/16  [name], MD  dicyclomine (BENTYL) 10 MG capsule Take 1 capsule (10 mg total) by mouth 4 (four) times daily. 12/04/17 01/03/18  Ison Wichmann, Charline Bills, PA-C  fluconazole (DIFLUCAN) 150 MG tablet Take 1 tablet (150 mg total) by mouth once a week. 12/04/17   Tighe Gitto, Charline Bills, PA-C  metoCLOPramide (REGLAN) 10 MG tablet Take 10 mg by mouth 4 (four) times daily.    [provider]  metroNIDAZOLE (FLAGYL) 500 MG tablet Take 1 tablet (500 mg total) by mouth 2 (two) times daily. 12/04/17   Shade Kaley, Charline Bills, PA-C  ondansetron (ZOFRAN-ODT) 4 MG disintegrating tablet Take 1 tablet (4 mg total) by mouth every 8 (eight) hours as needed for nausea or vomiting. 12/04/17   Arabelle Bollig, Charline Bills, PA-C  pantoprazole (PROTONIX) 40 MG tablet Take 1 tablet (40 mg  total) by mouth daily. 09/25/16 09/25/17  Harvest Dark, MD  predniSONE (DELTASONE) 20 MG tablet Take 2 tablets (40 mg total) by mouth daily. 09/25/16   Harvest Dark, MD  sucralfate (CARAFATE) 1 GM/10ML suspension Take 10 mLs (1 g total) by mouth 4 (four) times daily -  with meals and at bedtime. 12/20/15   [name], MD  SUMAtriptan (IMITREX) 100 MG  tablet Take 100 mg by mouth every 2 (two) hours as needed for migraine. May repeat in 2 hours if headache persists or recurs.    [provider]    Allergies Patient has no known allergies.  Family History  Problem Relation Age of Onset  . Hypertension Mother   . Asthma Brother     Social History Social History   Tobacco Use  . Smoking status: Former Research scientist (life sciences)  . Smokeless tobacco: Never Used  Substance Use Topics  . Alcohol use: No  . Drug use: No     Review of Systems  Constitutional: No fever/chills Eyes: No visual changes. No discharge ENT: No upper respiratory complaints. Cardiovascular: no chest pain. Respiratory: no cough. No SOB. Gastrointestinal: No abdominal pain but endorses left-sided abdominal cramping.  No nausea, no vomiting.  Positive diarrhea.  No constipation. enitourinary: Negative for dysuria. No hematuria Musculoskeletal: Negative for musculoskeletal pain. Skin: Positive for erythematous rash to bilateral groin Neurological: Negative for headaches, focal weakness or numbness. 10-point ROS otherwise negative.  ____________________________________________   PHYSICAL EXAM:  VITAL SIGNS: ED Triage Vitals  Enc Vitals Group     BP 12/04/17 1508 118/79     Pulse Rate 12/04/17 1508 96     Resp 12/04/17 1508 15     Temp 12/04/17 1508 98.2 F (36.8 C)     Temp Source 12/04/17 1508 Oral     SpO2 12/04/17 1508 98 %     Weight 12/04/17 1509 175 lb (79.4 kg)     Height 12/04/17 1509 6\' 1"  (1.854 m)     Head Circumference --      Peak Flow --      Pain Score 12/04/17 1508 8     Pain Loc --      Pain Edu? --      Excl. in [place]? --      Constitutional: Alert and oriented. Well appearing and in no acute distress. Eyes: Conjunctivae are normal. PERRL. EOMI. Head: Atraumatic. Neck: No stridor.    Cardiovascular: Normal rate, regular rhythm. Normal S1 and S2.  Good peripheral circulation. Respiratory: Normal respiratory effort without tachypnea  or retractions. Lungs CTAB. Good air entry to the bases with no decreased or absent breath sounds. Gastrointestinal: No visible abnormality to the abdominal wall.  Bowel sounds 4 quadrants. Soft and nontender to palpation. No guarding or rigidity. No palpable masses. No distention. No CVA tenderness. Musculoskeletal: Full range of motion to all extremities. No gross deformities appreciated. Neurologic:  Normal speech and language. No gross focal neurologic deficits are appreciated.  Skin:  Skin is warm, dry and intact.  Patient with erythematous rash to bilateral groin.  Rash is consistent with candidal skin infection.  No indication of cellulitis. Psychiatric: Mood and affect are normal. Speech and behavior are normal. Patient exhibits appropriate insight and judgement.   ____________________________________________   LABS (all labs ordered are listed, but only abnormal results are displayed)  Labs Reviewed - No data to display ____________________________________________  EKG   ____________________________________________  RADIOLOGY   No results found.  ____________________________________________    PROCEDURES  Procedure(s) performed:    Procedures    Medications - No data to display   ____________________________________________   INITIAL IMPRESSION / ASSESSMENT AND PLAN / ED COURSE  Pertinent labs & imaging results that were available during my care of the patient were reviewed by me and considered in my medical decision making (see chart for details).  Review of the [place] CSRS was performed in accordance of the [place] prior to dispensing any controlled drugs.      Patient's diagnosis is consistent with abdominal cramping, diarrhea, candidal skin infection.  Patient presents the emergency department with ongoing complaint of abdominal cramps and diarrhea.  Patient has been experiencing symptoms for several years without diagnosis.  Patient has had extensive  work-ups by gastroenterology without definitive diagnosis.  At this time, patient's diarrhea has changed and includes mucus.  As such, I will treat for presumed infectious origin.  Patient will be treated with Flagyl, Bentyl for abdominal complaints.  Patient reports that he is sometimes nauseated and I will prescribe Zofran for same.  Patient will be prescribed Diflucan weekly for candidal skin infection of the groin.  No work-up at this time.  Follow-up with GI.Marland Kitchen  Patient is given ED precautions to return to the ED for any worsening or new symptoms.     ____________________________________________  FINAL CLINICAL IMPRESSION(S) / ED DIAGNOSES  Final diagnoses:  Abdominal cramps  Diarrhea of presumed infectious origin  Candidal skin infection      NEW MEDICATIONS STARTED DURING THIS VISIT:  ED Discharge Orders         Ordered    ondansetron (ZOFRAN-ODT) 4 MG disintegrating tablet  Every 8 hours PRN     12/04/17 1700    dicyclomine (BENTYL) 10 MG capsule  4 times daily     12/04/17 1700    fluconazole (DIFLUCAN) 150 MG tablet  Weekly     12/04/17 1700    metroNIDAZOLE (FLAGYL) 500 MG tablet  2 times daily     12/04/17 1700              This chart was dictated using voice recognition software/Dragon. Despite best efforts to proofread, errors can occur which can change the meaning. Any change was purely unintentional.    Darletta Moll, PA-C 12/04/17 [place], [place], MD 12/05/17 [phone]

## 2017-12-04 NOTE — ED Triage Notes (Addendum)
Pt reports that he has "stomach problems" and has been followed by GI - c/o diarrhea (loose stools 4-5 times in 24 hours) - he reports that when he gets a cold his stomach hurts worse - pt states that he needs a work note because he has not been able to work since Monday Pt states that he has a rash in groin area and that it is "poison coming out of his system"

## 2018-08-12 ENCOUNTER — Emergency Department
Admission: EM | Admit: 2018-08-12 | Discharge: 2018-08-12 | Disposition: A | Discharge status: Home / Self Care | Payer: Self-pay | Attending: Emergency Medicine | Admitting: Emergency Medicine

## 2018-08-12 ENCOUNTER — Encounter: Payer: Self-pay | Admitting: Emergency Medicine

## 2018-08-12 ENCOUNTER — Other Ambulatory Visit: Payer: Self-pay

## 2018-08-12 DIAGNOSIS — Z87891 Personal history of nicotine dependence: Secondary | ICD-10-CM | POA: Insufficient documentation

## 2018-08-12 DIAGNOSIS — Z79899 Other long term (current) drug therapy: Secondary | ICD-10-CM | POA: Insufficient documentation

## 2018-08-12 DIAGNOSIS — J45909 Unspecified asthma, uncomplicated: Secondary | ICD-10-CM | POA: Insufficient documentation

## 2018-08-12 DIAGNOSIS — B86 Scabies: Secondary | ICD-10-CM | POA: Insufficient documentation

## 2018-08-12 MED ORDER — PERMETHRIN 5 % EX CREA: 1.0000 "application " | TOPICAL_CREAM | Freq: Once | CUTANEOUS | 0 refills | Status: AC

## 2018-08-12 NOTE — ED Provider Notes (Signed)
[facility] Emergency Department Provider Note   ____________________________________________   First MD Initiated Contact with Patient 08/12/18 [phone]     (approximate)  I have reviewed the triage vital signs and the nursing notes.   HISTORY  Chief Complaint Rash   HPI Brian Lutz is a 43 y.o. male presents to the ED with complaint of rash to both arms and hands for several days.  Patient states that he initially saw some places on his hands and now appears to be moving up his forearm.  He states the itching is worse at night.  He denies any contact with poison oak, poison ivy or sumac.  He initially thought that these were chiggers and has been using clear fingernail polish without relief.  Occasionally the bump becomes a vesicle and he is able to get "a black dot out of it".      Past Medical History:  Diagnosis Date  . Asthma   . MRSA (methicillin resistant staph aureus) culture positive     Patient Active Problem List   Diagnosis Date Noted  . Abdominal pain, epigastric   . Abscess of groin, left 02/05/2015  . Nausea 02/05/2015  . Hx MRSA infection 02/05/2015  . Cellulitis 02/05/2015  . Left leg DVT ([place]) 07/27/2014  . Acute embolism and thrombosis of deep vein of left lower extremity ([place]) 07/27/2014  . Cellulitis of left leg 07/24/2014    Past Surgical History:  Procedure Laterality Date  . CHOLECYSTECTOMY    . COLONOSCOPY WITH PROPOFOL N/A 12/02/2015   Procedure: COLONOSCOPY WITH PROPOFOL;  Surgeon: [name], MD;  Location: [facility] ENDOSCOPY;  Service: Endoscopy;  Laterality: N/A;  . ESOPHAGOGASTRODUODENOSCOPY (EGD) WITH PROPOFOL N/A 12/02/2015   Procedure: ESOPHAGOGASTRODUODENOSCOPY (EGD) WITH PROPOFOL;  Surgeon: [name], MD;  Location: [facility] ENDOSCOPY;  Service: Endoscopy;  Laterality: N/A;  . INCISION AND DRAINAGE ABSCESS Left 07/26/2014   Procedure: INCISION AND DRAINAGE LEFT LEG ABSCESS;  Surgeon: Marlyce Huge, MD;   Location: [facility] ORS;  Service: General;  Laterality: Left;    Prior to Admission medications   Medication Sig Start Date End Date Taking? Authorizing Provider  amitriptyline (ELAVIL) 10 MG tablet Take 1 tablet (10 mg total) by mouth at bedtime. 03/28/16 06/28/16  [name], MD  dicyclomine (BENTYL) 10 MG capsule Take 1 capsule (10 mg total) by mouth 4 (four) times daily. 12/04/17 01/03/18  Cuthriell, Charline Bills, PA-C  permethrin (ELIMITE) 5 % cream Apply 1 application topically once for 1 dose. 08/12/18 08/12/18  Johnn Hai, PA-C  SUMAtriptan (IMITREX) 100 MG tablet Take 100 mg by mouth every 2 (two) hours as needed for migraine. May repeat in 2 hours if headache persists or recurs.    [provider]    Allergies Patient has no known allergies.  Family History  Problem Relation Age of Onset  . Hypertension Mother   . Asthma Brother     Social History Social History   Tobacco Use  . Smoking status: Former Research scientist (life sciences)  . Smokeless tobacco: Never Used  Substance Use Topics  . Alcohol use: No  . Drug use: No    Review of Systems Constitutional: No fever/chills Cardiovascular: Denies chest pain. Respiratory: Denies shortness of breath. Musculoskeletal: Negative for muscle aches. Skin: Positive for skin rash hands and forearm. Neurological: Negative for headaches, focal weakness or numbness. ____________________________________________   PHYSICAL EXAM:  VITAL SIGNS: ED Triage Vitals [08/12/18 0821]  Enc Vitals Group     BP  Pulse      Resp      Temp      Temp src      SpO2      Weight 185 lb (83.9 kg)     Height 6\' 1"  (1.854 m)     Head Circumference      Peak Flow      Pain Score 0     Pain Loc      Pain Edu?      Excl. in [place]?    Constitutional: Alert and oriented. Well appearing and in no acute distress. Eyes: Conjunctivae are normal.  Head: Atraumatic. Neck: No stridor.   Cardiovascular: Normal rate, regular rhythm. Grossly normal heart sounds.   Good peripheral circulation. Respiratory: Normal respiratory effort.  No retractions. Lungs CTAB. Musculoskeletal: Moves upper lower extremities without difficulty.  Normal gait was noted. Neurologic:  Normal speech and language. No gross focal neurologic deficits are appreciated.  Skin:  Skin is warm, dry.  There are multiple erythematous papules with some open lesions present on the fingers, webspaces and forearm.  There is also some nondiscolored papular lesions in a linear pattern from 1 erythematous papule to another.  This is noted mostly on the forearm.  No other lesions are noted on the trunk or lower extremities. Psychiatric: Mood and affect are normal. Speech and behavior are normal.  ____________________________________________   LABS (all labs ordered are listed, but only abnormal results are displayed)  Labs Reviewed - No data to display  PROCEDURES  Procedure(s) performed (including Critical Care):  Procedures   ____________________________________________   INITIAL IMPRESSION / ASSESSMENT AND PLAN / ED COURSE  As part of my medical decision making, I reviewed the following data within the electronic MEDICAL RECORD NUMBER Notes from prior ED visits and [place] Controlled Substance Database  43 year old male presents to the ED with complaint of rash to his hands and forearms that started several days ago and itches worse at night.  He denies any exposure to poisonous plants.  He is also used fingernail polish for chiggers without any relief.  Pattern is more consistent with scabies.  Patient was given a prescription for Elimite cream.  He is to follow-up with [facility] if any continued problems.  He was also encouraged to take Benadryl 2 capsules before going to bed.  ____________________________________________   FINAL CLINICAL IMPRESSION(S) / ED DIAGNOSES  Final diagnoses:  Scabies     ED Discharge Orders         Ordered    permethrin (ELIMITE) 5 % cream    Once     08/12/18 5456           Note:  This document was prepared using Dragon voice recognition software and may include unintentional dictation errors.    Johnn Hai, PA-C 08/12/18 2563    Lavonia Drafts, MD 08/12/18 1321

## 2018-08-12 NOTE — Discharge Instructions (Addendum)
Follow-up with [facility] if not improving.  Call and make an appointment and tell them that you were referred from the emergency department.  Use the cream as directed.  The cream is to be applied as 1 dose from your neck down and leave on as directed.  Shower off.  Wash all bed sheets in hot water.

## 2018-08-12 NOTE — ED Triage Notes (Signed)
Presents with rash to both arms and hands  Red areas noted to arms .. positive itching

## 2019-05-27 ENCOUNTER — Other Ambulatory Visit: Payer: Self-pay

## 2019-05-27 DIAGNOSIS — Z8614 Personal history of Methicillin resistant Staphylococcus aureus infection: Secondary | ICD-10-CM | POA: Insufficient documentation

## 2019-05-27 DIAGNOSIS — M79605 Pain in left leg: Secondary | ICD-10-CM | POA: Insufficient documentation

## 2019-05-27 DIAGNOSIS — L989 Disorder of the skin and subcutaneous tissue, unspecified: Secondary | ICD-10-CM | POA: Insufficient documentation

## 2019-05-27 DIAGNOSIS — Z5321 Procedure and treatment not carried out due to patient leaving prior to being seen by health care provider: Secondary | ICD-10-CM | POA: Insufficient documentation

## 2019-05-27 DIAGNOSIS — M79641 Pain in right hand: Secondary | ICD-10-CM | POA: Insufficient documentation

## 2019-05-27 LAB — CBC WITH DIFFERENTIAL/PLATELET
Abs Immature Granulocytes: 0.02 10*3/uL (ref 0.00–0.07)
Basophils Absolute: 0 10*3/uL (ref 0.0–0.1)
Basophils Relative: 0 %
Eosinophils Absolute: 0.6 10*3/uL — ABNORMAL HIGH (ref 0.0–0.5)
Eosinophils Relative: 7 %
HCT: 41.1 % (ref 39.0–52.0)
Hemoglobin: 14.4 g/dL (ref 13.0–17.0)
Immature Granulocytes: 0 %
Lymphocytes Relative: 26 %
Lymphs Abs: 2.2 10*3/uL (ref 0.7–4.0)
MCH: 30.3 pg (ref 26.0–34.0)
MCHC: 35 g/dL (ref 30.0–36.0)
MCV: 86.3 fL (ref 80.0–100.0)
Monocytes Absolute: 0.6 10*3/uL (ref 0.1–1.0)
Monocytes Relative: 8 %
Neutro Abs: 4.9 10*3/uL (ref 1.7–7.7)
Neutrophils Relative %: 59 %
Platelets: 294 10*3/uL (ref 150–400)
RBC: 4.76 MIL/uL (ref 4.22–5.81)
RDW: 12 % (ref 11.5–15.5)
WBC: 8.3 10*3/uL (ref 4.0–10.5)
nRBC: 0 % (ref 0.0–0.2)

## 2019-05-27 LAB — COMPREHENSIVE METABOLIC PANEL
ALT: 34 U/L (ref 0–44)
AST: 28 U/L (ref 15–41)
Albumin: 4.3 g/dL (ref 3.5–5.0)
Alkaline Phosphatase: 117 U/L (ref 38–126)
Anion gap: 10 (ref 5–15)
BUN: 12 mg/dL (ref 6–20)
CO2: 28 mmol/L (ref 22–32)
Calcium: 9.2 mg/dL (ref 8.9–10.3)
Chloride: 101 mmol/L (ref 98–111)
Creatinine, Ser: 0.88 mg/dL (ref 0.61–1.24)
GFR calc Af Amer: >60 mL/min (ref >60)
GFR calc non Af Amer: >60 mL/min (ref >60)
Glucose, Bld: 107 mg/dL — ABNORMAL HIGH (ref 70–99)
Potassium: 3.6 mmol/L (ref 3.5–5.1)
Sodium: 139 mmol/L (ref 135–145)
Total Bilirubin: 0.5 mg/dL (ref 0.3–1.2)
Total Protein: 8.3 g/dL — ABNORMAL HIGH (ref 6.5–8.1)

## 2019-05-27 LAB — LACTIC ACID, PLASMA: Lactic Acid, Venous: 1 mmol/L (ref 0.5–1.9)

## 2019-05-27 MED ORDER — SODIUM CHLORIDE 0.9% FLUSH: 3.0000 mL | Freq: Once | INTRAVENOUS | Status: DC

## 2019-05-27 NOTE — ED Notes (Signed)
Full rainbow + lactic with one set of cultures sent  To lab

## 2019-05-27 NOTE — ED Triage Notes (Addendum)
Pt comes via POV from home with c/o right hand pain with open sores and left leg pain with open sores. Pt states drainage yesterday. 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 Pt unsure if it was some kind of bacteria. Pt states sore throat also.  Pt states this started a few days ago. Pt has redness with open sores, no drainage noted.

## 2019-05-28 ENCOUNTER — Emergency Department
Admission: EM | Admit: 2019-05-28 | Discharge: 2019-05-28 | Disposition: A | Discharge status: Home / Self Care | Payer: Medicaid Other | Attending: Emergency Medicine | Admitting: Emergency Medicine

## 2019-05-28 DIAGNOSIS — L089 Local infection of the skin and subcutaneous tissue, unspecified: Secondary | ICD-10-CM | POA: Insufficient documentation

## 2019-05-28 DIAGNOSIS — B9689 Other specified bacterial agents as the cause of diseases classified elsewhere: Secondary | ICD-10-CM | POA: Insufficient documentation

## 2019-05-28 DIAGNOSIS — Z87891 Personal history of nicotine dependence: Secondary | ICD-10-CM | POA: Insufficient documentation

## 2019-05-28 MED ORDER — NAPROXEN 500 MG PO TABS
500.0000 mg | ORAL_TABLET | Freq: Two times a day (BID) | ORAL | Status: DC
Start: 2019-05-28 — End: 2020-02-29

## 2019-05-28 MED ORDER — CLINDAMYCIN PHOSPHATE 600 MG/4ML IJ SOLN
600.0000 mg | Freq: Once | INTRAMUSCULAR | Status: AC
  Administered 2019-05-28: 600 mg via INTRAMUSCULAR
  Filled 2019-05-28: qty 4

## 2019-05-28 MED ORDER — CLINDAMYCIN HCL 300 MG PO CAPS
300.0000 mg | ORAL_CAPSULE | Freq: Three times a day (TID) | ORAL | 0 refills | Status: AC
Start: 2019-05-28 — End: 2019-06-07

## 2019-05-28 NOTE — Discharge Instructions (Addendum)
Follow discharge care instruction use antibacterial soap provided.  Take antibiotics as directed.  Advised [facility] with [facility] in case of recurrence.

## 2019-05-28 NOTE — ED Notes (Signed)
No answer when called several times from lobby 

## 2019-05-28 NOTE — ED Provider Notes (Signed)
[facility] Emergency Department Provider Note   ____________________________________________   First MD Initiated Contact with Patient 05/28/19 1429     (approximate)  I have reviewed the triage vital signs and the nursing notes.   HISTORY  Chief Complaint Abscess    HPI LEM LANS is a 44 y.o. male patient presents with open sores on the right dorsal aspect the hand and the distal third of the left lower extremity.  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  Patient concerned due to history of MRSA.  Patient was seen here last night but left without being seen due to the long wait time.  Patient rates his pain as 6/10.  Patient described pain is "achy".  No fever or drainage at this time.         Past Medical History:  Diagnosis Date  . Asthma   . MRSA (methicillin resistant staph aureus) culture positive     Patient Active Problem List   Diagnosis Date Noted  . Abdominal pain, epigastric   . Abscess of groin, left 02/05/2015  . Nausea 02/05/2015  . Hx MRSA infection 02/05/2015  . Cellulitis 02/05/2015  . Left leg DVT ([place]) 07/27/2014  . Acute embolism and thrombosis of deep vein of left lower extremity ([place]) 07/27/2014  . Cellulitis of left leg 07/24/2014    Past Surgical History:  Procedure Laterality Date  . CHOLECYSTECTOMY    . COLONOSCOPY WITH PROPOFOL N/A 12/02/2015   Procedure: COLONOSCOPY WITH PROPOFOL;  Surgeon: [name], MD;  Location: [facility] ENDOSCOPY;  Service: Endoscopy;  Laterality: N/A;  . ESOPHAGOGASTRODUODENOSCOPY (EGD) WITH PROPOFOL N/A 12/02/2015   Procedure: ESOPHAGOGASTRODUODENOSCOPY (EGD) WITH PROPOFOL;  Surgeon: [name], MD;  Location: [facility] ENDOSCOPY;  Service: Endoscopy;  Laterality: N/A;  . INCISION AND DRAINAGE ABSCESS Left 07/26/2014   Procedure: INCISION AND DRAINAGE LEFT LEG ABSCESS;  Surgeon: Marlyce Huge, MD;  Location: [facility] ORS;  Service: General;  Laterality:  Left;    Prior to Admission medications   Medication Sig Start Date End Date Taking? Authorizing Provider  amitriptyline (ELAVIL) 10 MG tablet Take 1 tablet (10 mg total) by mouth at bedtime. 03/28/16 06/28/16  [name], MD  clindamycin (CLEOCIN) 300 MG capsule Take 1 capsule (300 mg total) by mouth 3 (three) times daily for 10 days. 05/28/19 06/07/19  Sable Feil, PA-C  clindamycin (CLEOCIN) 300 MG capsule Take 1 capsule (300 mg total) by mouth 3 (three) times daily for 10 days. 05/28/19 06/07/19  Sable Feil, PA-C  dicyclomine (BENTYL) 10 MG capsule Take 1 capsule (10 mg total) by mouth 4 (four) times daily. 12/04/17 01/03/18  Cuthriell, Charline Bills, PA-C  naproxen (NAPROSYN) 500 MG tablet Take 1 tablet (500 mg total) by mouth 2 (two) times daily with a meal. 05/28/19   Sable Feil, PA-C  SUMAtriptan (IMITREX) 100 MG tablet Take 100 mg by mouth every 2 (two) hours as needed for migraine. May repeat in 2 hours if headache persists or recurs.    [provider]    Allergies Patient has no known allergies.  Family History  Problem Relation Age of Onset  . Hypertension Mother   . Asthma Brother     Social History Social History   Tobacco Use  . Smoking status: Former Research scientist (life sciences)  . Smokeless tobacco: Never Used  Substance Use Topics  . Alcohol use: No  . Drug use: No    Review of Systems Constitutional: No fever/chills Eyes: No  visual changes. ENT: No sore throat. Cardiovascular: Denies chest pain. Respiratory: Denies shortness of breath. Gastrointestinal: No abdominal pain.  No nausea, no vomiting.  No diarrhea.  No constipation. Genitourinary: Negative for dysuria. Musculoskeletal: Negative for back pain. Skin: Negative for rash.  Ulcer lesion on erythematous base dorsal right hand and distal third of left lower extremity. Neurological: Negative for headaches, focal weakness or numbness.   ____________________________________________   PHYSICAL EXAM:  VITAL  SIGNS: ED Triage Vitals  Enc Vitals Group     BP 05/28/19 1202 140/82     Pulse Rate 05/28/19 1202 81     Resp 05/28/19 1202 20     Temp 05/28/19 1202 98.5 F (36.9 C)     Temp Source 05/28/19 1202 Oral     SpO2 05/28/19 1202 97 %     Weight 05/28/19 1359 190 lb (86.2 kg)     Height 05/28/19 1359 6\' 1"  (1.854 m)     Head Circumference --      Peak Flow --      Pain Score 05/28/19 1202 6     Pain Loc --      Pain Edu? --      Excl. in [place]? --    Constitutional: Alert and oriented. Well appearing and in no acute distress. Cardiovascular: Normal rate, regular rhythm. Grossly normal heart sounds.  Good peripheral circulation. Respiratory: Normal respiratory effort.  No retractions. Lungs CTAB. Neurologic:  Normal speech and language. No gross focal neurologic deficits are appreciated. No gait instability. Skin:  Skin is warm, dry and intact.  Papular lesion on erythematous base dorsal right hand and distal third of left lower extremity. Psychiatric: Mood and affect are normal. Speech and behavior are normal.  ____________________________________________   LABS (all labs ordered are listed, but only abnormal results are displayed)  Labs Reviewed - No data to display ____________________________________________  EKG   ____________________________________________  RADIOLOGY  ED MD interpretation:    Official radiology report(s): No results found.  ____________________________________________   PROCEDURES  Procedure(s) performed (including Critical Care):  Procedures   ____________________________________________   INITIAL IMPRESSION / ASSESSMENT AND PLAN / ED COURSE  As part of my medical decision making, I reviewed the following data within the [place]     Patient presents with pressure lesion erythematous base on upper and lower extremities.  Physical exam consistent with bacterial skin infection.  Discussed lab results with patient.   Patient given prescription for clindamycin and naproxen.  Patient given discharge care instruction advised to use antibacterial soap to clean skin.  Patient [facility].    LEIGHLAND MISFELDT was evaluated in Emergency Department on 05/28/2019 for the symptoms described in the history of present illness. He was evaluated in the context of the global COVID-19 pandemic, which necessitated consideration that the patient might be at risk for infection with the SARS-CoV-2 virus that causes COVID-19. Institutional protocols and algorithms that pertain to the evaluation of patients at risk for COVID-19 are in a state of rapid change based on information released by regulatory bodies including the CDC and federal and state organizations. These policies and algorithms were followed during the patient's care in the ED.       ____________________________________________   FINAL CLINICAL IMPRESSION(S) / ED DIAGNOSES  Final diagnoses:  Bacterial infection of skin     ED Discharge Orders         Ordered    clindamycin (CLEOCIN) 300 MG capsule  3 times daily     05/28/19 1441    clindamycin (CLEOCIN) 300 MG capsule  3 times daily     05/28/19 1449    naproxen (NAPROSYN) 500 MG tablet  2 times daily with meals     05/28/19 1449           Note:  This document was prepared using Dragon voice recognition software and may include unintentional dictation errors.    Sable Feil, PA-C 05/28/19 1453    Duffy Bruce, MD 05/28/19 2252

## 2019-05-28 NOTE — ED Triage Notes (Signed)
Several days of R hand and LLE pain with open sores. H/o MRSA requiring IV abx and hospitalization in the past. Was here last night for same but LWBS.

## 2019-07-09 ENCOUNTER — Telehealth: Payer: Self-pay | Admitting: General Practice

## 2019-07-09 NOTE — Telephone Encounter (Signed)
Individual has been contacted 3+ times regarding ED referral and each time the call could not be completed. No further attempts to contact the individual will be made.

## 2020-02-29 ENCOUNTER — Emergency Department
Admission: EM | Admit: 2020-02-29 | Discharge: 2020-02-29 | Disposition: A | Discharge status: Home / Self Care | Payer: Self-pay | Attending: Emergency Medicine | Admitting: Emergency Medicine

## 2020-02-29 ENCOUNTER — Other Ambulatory Visit: Payer: Self-pay

## 2020-02-29 ENCOUNTER — Emergency Department: Payer: Self-pay

## 2020-02-29 ENCOUNTER — Encounter: Payer: Self-pay | Admitting: Emergency Medicine

## 2020-02-29 DIAGNOSIS — Z87891 Personal history of nicotine dependence: Secondary | ICD-10-CM | POA: Insufficient documentation

## 2020-02-29 DIAGNOSIS — S39012A Strain of muscle, fascia and tendon of lower back, initial encounter: Secondary | ICD-10-CM | POA: Insufficient documentation

## 2020-02-29 DIAGNOSIS — J45909 Unspecified asthma, uncomplicated: Secondary | ICD-10-CM | POA: Insufficient documentation

## 2020-02-29 DIAGNOSIS — X501XXA Overexertion from prolonged static or awkward postures, initial encounter: Secondary | ICD-10-CM | POA: Insufficient documentation

## 2020-02-29 DIAGNOSIS — Y9389 Activity, other specified: Secondary | ICD-10-CM | POA: Insufficient documentation

## 2020-02-29 MED ORDER — BACLOFEN 10 MG PO TABS
10.0000 mg | ORAL_TABLET | Freq: Three times a day (TID) | ORAL | 0 refills | Status: AC
Start: 2020-02-29 — End: 2020-03-10

## 2020-02-29 MED ORDER — MELOXICAM 15 MG PO TABS
15.0000 mg | ORAL_TABLET | Freq: Every day | ORAL | 2 refills | Status: AC
Start: 2020-02-29 — End: 2021-02-28

## 2020-02-29 MED ORDER — KETOROLAC TROMETHAMINE 60 MG/2ML IM SOLN
60.0000 mg | Freq: Once | INTRAMUSCULAR | Status: AC
  Administered 2020-02-29: 60 mg via INTRAMUSCULAR
  Filled 2020-02-29: qty 2

## 2020-02-29 MED ORDER — LIDOCAINE 5 % EX PTCH
1.0000 | MEDICATED_PATCH | CUTANEOUS | Status: DC
  Administered 2020-02-29: 1 via TRANSDERMAL
  Filled 2020-02-29: qty 1

## 2020-02-29 MED ORDER — TRAMADOL HCL 50 MG PO TABS: 50.0000 mg | ORAL_TABLET | Freq: Four times a day (QID) | ORAL | 0 refills | Status: DC | PRN

## 2020-02-29 NOTE — Discharge Instructions (Signed)
Follow-up with your regular doctor if not improving in 3 to 5 days or follow-up with orthopedics.  Please call for an appointment.   Apply ice to your lower back Take the medication as prescribed Return to emergency department worsening

## 2020-02-29 NOTE — ED Notes (Signed)
See triage note  Presents with lower back pain since last pm..  States he is not sure of injury but has been bending to feed chicken  Ambulates well

## 2020-02-29 NOTE — ED Provider Notes (Signed)
[facility] Emergency Department Provider Note  ____________________________________________   Event Date/Time   First MD Initiated Contact with Patient 02/29/20 1621     (approximate)  I have reviewed the triage vital signs and the nursing notes.   HISTORY  Chief Complaint Back Pain    HPI Brian Lutz is a 45 y.o. 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  Pain radiates to both thighs bilaterally, pain is worse with movement, increased with bending over, denies numbness, tingling, or changes in bowel/urinary habits,  Using otc meds without relief Remainder ros neg   Past Medical History:  Diagnosis Date  . Asthma   . MRSA (methicillin resistant staph aureus) culture positive     Patient Active Problem List   Diagnosis Date Noted  . Abdominal pain, epigastric   . Abscess of groin, left 02/05/2015  . Nausea 02/05/2015  . Hx MRSA infection 02/05/2015  . Cellulitis 02/05/2015  . Left leg DVT ([place]) 07/27/2014  . Acute embolism and thrombosis of deep vein of left lower extremity ([place]) 07/27/2014  . Cellulitis of left leg 07/24/2014    Past Surgical History:  Procedure Laterality Date  . CHOLECYSTECTOMY    . COLONOSCOPY WITH PROPOFOL N/A 12/02/2015   Procedure: COLONOSCOPY WITH PROPOFOL;  Surgeon: [name], MD;  Location: [facility] ENDOSCOPY;  Service: Endoscopy;  Laterality: N/A;  . ESOPHAGOGASTRODUODENOSCOPY (EGD) WITH PROPOFOL N/A 12/02/2015   Procedure: ESOPHAGOGASTRODUODENOSCOPY (EGD) WITH PROPOFOL;  Surgeon: [name], MD;  Location: [facility] ENDOSCOPY;  Service: Endoscopy;  Laterality: N/A;  . INCISION AND DRAINAGE ABSCESS Left 07/26/2014   Procedure: INCISION AND DRAINAGE LEFT LEG ABSCESS;  Surgeon: Marlyce Huge, MD;  Location: [facility] ORS;  Service: General;  Laterality: Left;    Prior to Admission medications    Medication Sig Start Date End Date Taking? Authorizing Provider  baclofen (LIORESAL) 10 MG tablet Take 1 tablet (10 mg total) by mouth 3 (three) times daily for 10 days. 02/29/20 03/10/20 Yes Fisher, Linden Dolin, PA-C  meloxicam (MOBIC) 15 MG tablet Take 1 tablet (15 mg total) by mouth daily. 02/29/20 02/28/21 Yes Fisher, Linden Dolin, PA-C  traMADol (ULTRAM) 50 MG tablet Take 1 tablet (50 mg total) by mouth every 6 (six) hours as needed. 02/29/20  Yes Fisher, Linden Dolin, PA-C  amitriptyline (ELAVIL) 10 MG tablet Take 1 tablet (10 mg total) by mouth at bedtime. 03/28/16 06/28/16  [name], MD  SUMAtriptan (IMITREX) 100 MG tablet Take 100 mg by mouth every 2 (two) hours as needed for migraine. May repeat in 2 hours if headache persists or recurs.    [provider]    Allergies Patient has no known allergies.  Family History  Problem Relation Age of Onset  . Hypertension Mother   . Asthma Brother     Social History Social History   Tobacco Use  . Smoking status: Former Research scientist (life sciences)  . Smokeless tobacco: Never Used  Vaping Use  . Vaping Use: Every day  Substance Use Topics  . Alcohol use: No  . Drug use: No    Review of Systems  Constitutional: No fever/chills Eyes: No visual changes. ENT: No sore throat. Respiratory: Denies cough Genitourinary: Negative for dysuria. Musculoskeletal: Positive for back pain. Skin: Negative for rash.    ____________________________________________   PHYSICAL EXAM:  VITAL SIGNS: ED Triage Vitals  Enc Vitals Group  BP 02/29/20 1621 132/85     Pulse Rate 02/29/20 1621 69     Resp 02/29/20 1621 18     Temp 02/29/20 1621 97.8 F (36.6 C)     Temp Source 02/29/20 1621 Oral     SpO2 02/29/20 1621 98 %     Weight 02/29/20 1624 180 lb (81.6 kg)     Height 02/29/20 1624 6\' 1"  (1.854 m)     Head Circumference --      Peak Flow --      Pain Score --      Pain Loc --      Pain Edu? --      Excl. in [place]? --     Constitutional: Alert and  oriented. Well appearing and in no acute distress. Eyes: Conjunctivae are normal.  Head: Atraumatic. Nose: No congestion/rhinnorhea. Mouth/Throat: Mucous membranes are moist.   Neck:  supple no lymphadenopathy noted Cardiovascular: Normal rate, regular rhythm.  Respiratory: Normal respiratory effort.  No retractions, l GU: deferred Musculoskeletal: FROM all extremities, warm and well perfused.  Decreased rom of back due to discomfort, lumbar spine tender, negative slr, 5/5 strength in great toes b/l, 5/5 strength in lower legs, n/v intact, patient does appear to be quite uncomfortable and is bracing himself so does not put pressure on his back Neurologic:  Normal speech and language.  Skin:  Skin is warm, dry and intact. No rash noted. Psychiatric: Mood and affect are normal. Speech and behavior are normal.  ____________________________________________   LABS (all labs ordered are listed, but only abnormal results are displayed)  Labs Reviewed - No data to display ____________________________________________   ____________________________________________  RADIOLOGY  X-ray lumbar spine  ____________________________________________   PROCEDURES  Procedure(s) performed: Toradol 60 mg IM, Lidoderm patch   Procedures    ____________________________________________   INITIAL IMPRESSION / ASSESSMENT AND PLAN / ED COURSE  Pertinent labs & imaging results that were available during my care of the patient were reviewed by me and considered in my medical decision making (see chart for details).   Patient is a 45 year old male presents with low back pain after feeding chickens.  See HPI.  Physical exam shows patient to appear stable.  Does appear to be uncomfortable.  Lumbar spine is mildly tender  X-ray lumbar spine  Toradol 60 mg IM, Lidoderm patch  Patient does not have a ride home as he drove himself so do not feel we can give him a muscle relaxer or narcotic at this  time   X-ray lumbar spine reviewed by me confirmed by radiology as normal  Patient did not get a lateral relief with the Toradol and Lidoderm patch.  Explained to him he most likely needs a muscle relaxer.  He was given a prescription for meloxicam, baclofen, and tramadol.  He is to apply ice to the lower back.  He was given a work note.  He was discharged stable condition.  As part of my medical decision making, I reviewed the following data within the [place] notes reviewed and incorporated, Old chart reviewed, Radiograph reviewed , Notes from prior ED visits and [place] Controlled Substance Database  ____________________________________________   FINAL CLINICAL IMPRESSION(S) / ED DIAGNOSES  Final diagnoses:  Acute myofascial strain of lumbar region, initial encounter      NEW MEDICATIONS STARTED DURING THIS VISIT:  Discharge Medication List as of 02/29/2020  5:32 PM    START taking these medications   Details  baclofen (LIORESAL) 10  MG tablet Take 1 tablet (10 mg total) by mouth 3 (three) times daily for 10 days., Starting Mon 02/29/2020, Until Thu 03/10/2020, Normal    meloxicam (MOBIC) 15 MG tablet Take 1 tablet (15 mg total) by mouth daily., Starting Mon 02/29/2020, Until Tue 02/28/2021, Normal    traMADol (ULTRAM) 50 MG tablet Take 1 tablet (50 mg total) by mouth every 6 (six) hours as needed., Starting Mon 02/29/2020, Normal         Note:  This document was prepared using Dragon voice recognition software and may include unintentional dictation errors.     Versie Starks, PA-C 02/29/20 Mellody Memos, MD 02/29/20 [phone]

## 2020-02-29 NOTE — ED Triage Notes (Signed)
Pt c/o lower back pain radiating into the right leg.

## 2020-04-24 ENCOUNTER — Encounter: Payer: Self-pay | Admitting: Emergency Medicine

## 2020-04-24 ENCOUNTER — Emergency Department
Admission: EM | Admit: 2020-04-24 | Discharge: 2020-04-24 | Disposition: A | Discharge status: Home / Self Care | Payer: Medicaid Other | Attending: Emergency Medicine | Admitting: Emergency Medicine

## 2020-04-24 ENCOUNTER — Emergency Department: Payer: Medicaid Other

## 2020-04-24 DIAGNOSIS — S0191XA Laceration without foreign body of unspecified part of head, initial encounter: Secondary | ICD-10-CM

## 2020-04-24 DIAGNOSIS — S0101XA Laceration without foreign body of scalp, initial encounter: Secondary | ICD-10-CM | POA: Insufficient documentation

## 2020-04-24 DIAGNOSIS — W01198A Fall on same level from slipping, tripping and stumbling with subsequent striking against other object, initial encounter: Secondary | ICD-10-CM | POA: Insufficient documentation

## 2020-04-24 DIAGNOSIS — S0990XA Unspecified injury of head, initial encounter: Secondary | ICD-10-CM

## 2020-04-24 DIAGNOSIS — J45909 Unspecified asthma, uncomplicated: Secondary | ICD-10-CM | POA: Insufficient documentation

## 2020-04-24 DIAGNOSIS — Z87891 Personal history of nicotine dependence: Secondary | ICD-10-CM | POA: Insufficient documentation

## 2020-04-24 MED ORDER — LIDOCAINE-EPINEPHRINE 2 %-1:100000 IJ SOLN
20.0000 mL | Freq: Once | INTRAMUSCULAR | Status: AC
  Administered 2020-04-24: 20 mL
  Filled 2020-04-24: qty 1

## 2020-04-24 MED ORDER — OXYCODONE-ACETAMINOPHEN 5-325 MG PO TABS
1.0000 | ORAL_TABLET | Freq: Once | ORAL | Status: AC
  Administered 2020-04-24: 1 via ORAL
  Filled 2020-04-24: qty 1

## 2020-04-24 NOTE — ED Notes (Signed)
D/C instructions given.  Discussed wound care and follow up.  All questions addressed.  Pt instructed not to drive.  Pt to ride home with friend.  Understanding verbalized.  Pt left ER ambulatory with steady gait.

## 2020-04-24 NOTE — Discharge Instructions (Signed)
Your scalp laceration was closed with absorbable sutures which will fall apart on their own in about 2 weeks.

## 2020-04-24 NOTE — ED Provider Notes (Signed)
[facility] Emergency Department Provider Note  ____________________________________________  Time seen: Approximately 9:02 AM  I have reviewed the triage vital signs and the nursing notes.   HISTORY  Chief Complaint Laceration    HPI Brian Lutz is a 45 y.o. male with a history of asthma who comes ED complaining of pain on the left side of his head after a mechanical trip and fall which caused him to hit his head on a chair this morning.  Reports there was a lot of bleeding from the scalp which made him worried.  Denies neck pain or paresthesias.  No motor weakness.  No vision changes.  Otherwise feels fine.      Past Medical History:  Diagnosis Date  . Asthma   . MRSA (methicillin resistant staph aureus) culture positive      Patient Active Problem List   Diagnosis Date Noted  . Abdominal pain, epigastric   . Abscess of groin, left 02/05/2015  . Nausea 02/05/2015  . Hx MRSA infection 02/05/2015  . Cellulitis 02/05/2015  . Left leg DVT ([place]) 07/27/2014  . Acute embolism and thrombosis of deep vein of left lower extremity ([place]) 07/27/2014  . Cellulitis of left leg 07/24/2014     Past Surgical History:  Procedure Laterality Date  . CHOLECYSTECTOMY    . COLONOSCOPY WITH PROPOFOL N/A 12/02/2015   Procedure: COLONOSCOPY WITH PROPOFOL;  Surgeon: [name], MD;  Location: [facility] ENDOSCOPY;  Service: Endoscopy;  Laterality: N/A;  . ESOPHAGOGASTRODUODENOSCOPY (EGD) WITH PROPOFOL N/A 12/02/2015   Procedure: ESOPHAGOGASTRODUODENOSCOPY (EGD) WITH PROPOFOL;  Surgeon: [name], MD;  Location: [facility] ENDOSCOPY;  Service: Endoscopy;  Laterality: N/A;  . INCISION AND DRAINAGE ABSCESS Left 07/26/2014   Procedure: INCISION AND DRAINAGE LEFT LEG ABSCESS;  Surgeon: Marlyce Huge, MD;  Location: [facility] ORS;  Service: General;  Laterality: Left;     Prior to Admission medications   Medication Sig Start Date End Date Taking? Authorizing Provider   amitriptyline (ELAVIL) 10 MG tablet Take 1 tablet (10 mg total) by mouth at bedtime. 03/28/16 06/28/16  [name], MD  meloxicam (MOBIC) 15 MG tablet Take 1 tablet (15 mg total) by mouth daily. 02/29/20 02/28/21  Caryn Section, Linden Dolin, PA-C  SUMAtriptan (IMITREX) 100 MG tablet Take 100 mg by mouth every 2 (two) hours as needed for migraine. May repeat in 2 hours if headache persists or recurs.    [provider]  traMADol (ULTRAM) 50 MG tablet Take 1 tablet (50 mg total) by mouth every 6 (six) hours as needed. 02/29/20   Versie Starks, PA-C     Allergies Patient has no known allergies.   Family History  Problem Relation Age of Onset  . Hypertension Mother   . Asthma Brother     Social History Social History   Tobacco Use  . Smoking status: Former Research scientist (life sciences)  . Smokeless tobacco: Never Used  Vaping Use  . Vaping Use: Every day  Substance Use Topics  . Alcohol use: No  . Drug use: No    Review of Systems  Constitutional:   No fever or chills.  ENT:   No sore throat. No rhinorrhea. Cardiovascular:   No chest pain or syncope. Respiratory:   No dyspnea or cough. Gastrointestinal:   Negative for abdominal pain, vomiting and diarrhea.  Musculoskeletal:   Negative for focal pain or swelling All other systems reviewed and are negative except as documented above in ROS and HPI.  ____________________________________________   PHYSICAL EXAM:  VITAL SIGNS:  ED Triage Vitals  Enc Vitals Group     BP 04/24/20 0659 (!) 147/93     Pulse Rate 04/24/20 0659 74     Resp 04/24/20 0659 17     Temp 04/24/20 0659 97.8 F (36.6 C)     Temp Source 04/24/20 0659 Oral     SpO2 04/24/20 0659 100 %     Weight --      Height --      Head Circumference --      Peak Flow --      Pain Score 04/24/20 0800 9     Pain Loc --      Pain Edu? --      Excl. in [place]? --     Vital signs reviewed, nursing assessments reviewed.   Constitutional:   Alert and oriented. Non-toxic appearance. Eyes:    Conjunctivae are normal. EOMI. PERRL. ENT      Head:   Normocephalic with 3 cm linear laceration on the left temporal scalp, currently hemostatic.      Nose:   Normal, no epistaxis.      Mouth/Throat:   No intraoral injury.  Moist mucosa.      Neck:   No meningismus. Full ROM. Hematological/Lymphatic/Immunilogical:   No cervical lymphadenopathy. Cardiovascular:   RRR. . Cap refill less than 2 seconds. Respiratory:   Normal respiratory effort without tachypnea/retractions. Musculoskeletal:   Normal range of motion in all extremities. Neurologic:   Normal speech and language.  Motor grossly intact. No acute focal neurologic deficits are appreciated.  Skin:    Skin is warm, dry with scalp laceration as above, no other wounds.  ____________________________________________    LABS (pertinent positives/negatives) (all labs ordered are listed, but only abnormal results are displayed) Labs Reviewed - No data to display ____________________________________________   EKG    ____________________________________________    RADIOLOGY  CT Head Wo Contrast  Result Date: 04/24/2020 CLINICAL DATA:  Minor head trauma with normal mental status. Laceration. EXAM: CT HEAD WITHOUT CONTRAST TECHNIQUE: Contiguous axial images were obtained from the base of the skull through the vertex without intravenous contrast. COMPARISON:  05/06/2009 FINDINGS: Brain: No evidence of acute infarction, hemorrhage, hydrocephalus, extra-axial collection or mass lesion/mass effect. Vascular: No hyperdense vessel or unexpected calcification. Skull: Left scalp laceration.  No acute fracture. Sinuses/Orbits: Negative IMPRESSION: 1. No evidence of intracranial injury. 2. Left scalp laceration without fracture. Electronically Signed   By: Monte Fantasia M.D.   On: 04/24/2020 07:32    ____________________________________________   PROCEDURES .Marland KitchenLaceration Repair  Date/Time: 04/24/2020 9:04 AM Performed by: Carrie Mew, MD Authorized by: Carrie Mew, MD   Consent:    Consent obtained:  Verbal   Consent given by:  Patient   Risks discussed:  Infection, pain, retained foreign body, poor cosmetic result and poor wound healing Universal protocol:    Patient identity confirmed:  Verbally with patient and arm band Anesthesia:    Anesthesia method:  Local infiltration   Local anesthetic:  Lidocaine 2% WITH epi Laceration details:    Location:  Scalp   Scalp location:  L parietal Pre-procedure details:    Preparation:  Patient was prepped and draped in usual sterile fashion and imaging obtained to evaluate for foreign bodies Exploration:    Hemostasis achieved with:  Direct pressure   Imaging obtained comment:  CT   Imaging outcome: foreign body not noted     Wound exploration: entire depth of wound visualized     Wound extent: no  foreign bodies/material noted, no muscle damage noted, no nerve damage noted, no tendon damage noted, no underlying fracture noted and no vascular damage noted     Contaminated: no   Treatment:    Area cleansed with:  Saline and povidone-iodine   Amount of cleaning:  Extensive   Irrigation solution:  Sterile saline   Irrigation method:  Pressure wash   Visualized foreign bodies/material removed: no     Debridement:  None   Undermining:  None Skin repair:    Repair method:  Sutures   Suture size:  2-0   Wound skin closure material used: Vicryl.   Suture technique:  Running   Number of sutures:  4 Approximation:    Approximation:  Close Repair type:    Repair type:  Simple Post-procedure details:    Dressing:  Open (no dressing)   Procedure completion:  Tolerated well, no immediate complications    ____________________________________________  DIFFERENTIAL DIAGNOSIS   Intracranial hemorrhage, skull fracture  CLINICAL IMPRESSION / ASSESSMENT AND PLAN / ED COURSE  Medications ordered in the ED: Medications  lidocaine-EPINEPHrine (XYLOCAINE  W/EPI) 2 %-1:100000 (with pres) injection 20 mL (has no administration in time range)  oxyCODONE-acetaminophen (PERCOCET/ROXICET) 5-325 MG per tablet 1 tablet (1 tablet Oral Given 04/24/20 0803)    Pertinent labs & imaging results that were available during my care of the patient were reviewed by me and considered in my medical decision making (see chart for details).  Brian Lutz was evaluated in Emergency Department on 04/24/2020 for the symptoms described in the history of present illness. He was evaluated in the context of the global COVID-19 pandemic, which necessitated consideration that the patient might be at risk for infection with the SARS-CoV-2 virus that causes COVID-19. Institutional protocols and algorithms that pertain to the evaluation of patients at risk for COVID-19 are in a state of rapid change based on information released by regulatory bodies including the CDC and federal and state organizations. These policies and algorithms were followed during the patient's care in the ED.   Patient comes ED after mechanical fall resulting in scalp laceration.  CT imaging obtained which is negative for intracranial hemorrhage or other serious traumatic injuries.  Wound was cleaned and repaired.  Tetanus updated.  Mental status is normal.  Tolerating p.o.  Stable for discharge.      ____________________________________________   FINAL CLINICAL IMPRESSION(S) / ED DIAGNOSES    Final diagnoses:  Traumatic head injury with multiple lacerations, initial encounter     ED Discharge Orders    None      Portions of this note were generated with dragon dictation software. Dictation errors may occur despite best attempts at proofreading.   Carrie Mew, MD 04/24/20 [phone]

## 2020-04-24 NOTE — ED Notes (Signed)
Attempted to clean blood off patient, pt asked me to stop, states he just wants to be left alone.

## 2020-04-24 NOTE — ED Notes (Signed)
Pt sitting up in bed, drinking gingerale.  Pt refused snacks/crackers.

## 2020-04-24 NOTE — ED Triage Notes (Signed)
Pt tripped over toy in floor and hit his head on wooden piece of chair. Pt has laceration (approx 2 inch) to the left front of parietal bone area. Bandage applied due to bleeding.   Pt denies LOC.

## 2021-02-27 ENCOUNTER — Ambulatory Visit (INDEPENDENT_AMBULATORY_CARE_PROVIDER_SITE_OTHER): Payer: 59 | Admitting: Nurse Practitioner

## 2021-02-27 ENCOUNTER — Encounter: Payer: Self-pay | Admitting: Nurse Practitioner

## 2021-02-27 ENCOUNTER — Other Ambulatory Visit: Payer: Self-pay

## 2021-02-27 VITALS — BP 132/83 | HR 62 | Temp 98.3°F | Resp 16 | Ht 73.0 in | Wt 183.0 lb

## 2021-02-27 DIAGNOSIS — R140 Abdominal distension (gaseous): Secondary | ICD-10-CM

## 2021-02-27 DIAGNOSIS — R1110 Vomiting, unspecified: Secondary | ICD-10-CM | POA: Diagnosis not present

## 2021-02-27 NOTE — Progress Notes (Signed)
@Patient  ID: Brian Lutz, male    DOB: Jan 05, 1975, 46 y.o.   MRN: [id number]  Chief Complaint  Patient presents with   Bloated   Abdominal Cramping    Referring provider: No ref. provider found  HPI  Patient presents today with chronic GI issues. He does have a past history of cholecystectomy 10 years ago. Patient complains of chronic oily stools, vomiting, decreased appetite, and bloating. We discussed that we can place a referral to GI for the patient. Denies f/c/s, n/v/d, hemoptysis, PND, chest pain or edema.   No Known Allergies   There is no immunization history on file for this patient.  Past Medical History:  Diagnosis Date   Asthma    MRSA (methicillin resistant staph aureus) culture positive     Tobacco History: Social History   Tobacco Use  Smoking Status Former  Smokeless Tobacco Never   Counseling given: Not Answered   Outpatient Encounter Medications as of 02/27/2021  Medication Sig   amitriptyline (ELAVIL) 10 MG tablet Take 1 tablet (10 mg total) by mouth at bedtime.   [EXPIRED] meloxicam (MOBIC) 15 MG tablet Take 1 tablet (15 mg total) by mouth daily. (Patient not taking: Reported on 02/27/2021)   SUMAtriptan (IMITREX) 100 MG tablet Take 100 mg by mouth every 2 (two) hours as needed for migraine. May repeat in 2 hours if headache persists or recurs. (Patient not taking: Reported on 02/27/2021)   traMADol (ULTRAM) 50 MG tablet Take 1 tablet (50 mg total) by mouth every 6 (six) hours as needed. (Patient not taking: Reported on 02/27/2021)   No facility-administered encounter medications on file as of 02/27/2021.     Review of Systems  Review of Systems  Constitutional:  Positive for appetite change.  HENT: Negative.    Cardiovascular: Negative.   Gastrointestinal:  Positive for abdominal distention and vomiting.  Allergic/Immunologic: Negative.   Neurological: Negative.   Psychiatric/Behavioral: Negative.        Physical Exam  BP 132/83 (BP  Location: Right Arm, Patient Position: Sitting, Cuff Size: Normal)    Pulse 62    Temp 98.3 F (36.8 C) (Oral)    Resp 16    Ht 6\' 1"  (1.854 m)    Wt 183 lb (83 kg)    SpO2 98%    BMI 24.14 kg/m   Wt Readings from Last 5 Encounters:  03/06/21 182 lb 15.7 oz (83 kg)  02/27/21 183 lb (83 kg)  02/29/20 180 lb (81.6 kg)  05/28/19 190 lb (86.2 kg)  05/27/19 190 lb (86.2 kg)     Physical Exam Vitals and nursing note reviewed.  Constitutional:      General: He is not in acute distress.    Appearance: He is well-developed.  Cardiovascular:     Rate and Rhythm: Normal rate and regular rhythm.  Pulmonary:     Effort: Pulmonary effort is normal.     Breath sounds: Normal breath sounds.  Abdominal:     General: Abdomen is flat. Bowel sounds are normal.     Palpations: Abdomen is soft.     Tenderness: There is no abdominal tenderness.  Skin:    General: Skin is warm and dry.  Neurological:     Mental Status: He is alert and oriented to person, place, and time.     Lab Results:  CBC    Component Value Date/Time   WBC 8.3 05/27/2019 1842   RBC 4.76 05/27/2019 1842   HGB 14.4 05/27/2019 1842   HGB  14.5 02/13/2011 1730   HCT 41.1 05/27/2019 1842   HCT 42.1 02/13/2011 1730   PLT 294 05/27/2019 1842   PLT 211 02/13/2011 1730   MCV 86.3 05/27/2019 1842   MCV 90 02/13/2011 1730   MCH 30.3 05/27/2019 1842   MCHC 35.0 05/27/2019 1842   RDW 12.0 05/27/2019 1842   RDW 12.8 02/13/2011 1730   LYMPHSABS 2.2 05/27/2019 1842   MONOABS 0.6 05/27/2019 1842   EOSABS 0.6 (H) 05/27/2019 1842   BASOSABS 0.0 05/27/2019 1842    BMET    Component Value Date/Time   NA 139 05/27/2019 1842   NA 142 02/13/2011 1730   K 3.6 05/27/2019 1842   K 3.7 02/13/2011 1730   CL 101 05/27/2019 1842   CL 104 02/13/2011 1730   CO2 28 05/27/2019 1842   CO2 30 02/13/2011 1730   GLUCOSE 107 (H) 05/27/2019 1842   GLUCOSE 86 02/13/2011 1730   BUN 12 05/27/2019 1842   BUN 15 02/13/2011 1730   CREATININE  0.88 05/27/2019 1842   CREATININE 1.28 02/13/2011 1730   CALCIUM 9.2 05/27/2019 1842   CALCIUM 8.7 02/13/2011 1730   GFRNONAA >60 05/27/2019 1842   GFRNONAA >60 02/13/2011 1730   GFRAA >60 05/27/2019 1842   GFRAA >60 02/13/2011 1730    BNP No results found for: BNP  ProBNP No results found for: PROBNP  Imaging: DG Chest 2 View  Result Date: 03/06/2021 CLINICAL DATA:  Productive cough and shortness of breath, possible COVID-19 infection. EXAM: CHEST - 2 VIEW COMPARISON:  None. FINDINGS: Lungs are adequately inflated without focal airspace consolidation or effusion. Cardiomediastinal silhouette is normal. Bones and soft tissues are normal. IMPRESSION: No active cardiopulmonary disease. Electronically Signed   By: Marin Olp M.D.   On: 03/06/2021 17:36     Assessment & Plan:   Chronic vomiting [place] diet  Stay well hydrated  Will place referral to [place], NP 03/08/2021

## 2021-02-27 NOTE — Patient Instructions (Addendum)
Chronic GI issues Vomiting:   [place] diet  Stay well hydrated  Will place referral to GI    Vomiting, Adult Vomiting is when stomach contents forcefully come out of the mouth. Many people notice nausea before vomiting. Vomiting can make you feel weak and cause you to become dehydrated. Dehydration can make you feel tired and thirsty, cause you to have a dry mouth, and decrease how often you urinate. Older adults and people who have other diseases or a weak body defense system (immune system) are at higher risk for dehydration. It is important to treat vomiting as told by your health care provider. Follow these instructions at home: Watch your symptoms for any changes. Tell your health care provider about them. Eating and drinking   Follow these recommendations as told by your health care provider: Take an oral rehydration solution (ORS). This is a drink that is sold at pharmacies and retail stores. Eat bland, easy-to-digest foods in small amounts as you are able. These foods include bananas, applesauce, rice, lean meats, toast, and crackers. Drink clear fluids slowly and in small amounts as you are able. Clear fluids include water, ice chips, low-calorie sports drinks, and fruit juice that has water added (diluted fruit juice). Avoid drinking fluids that contain a lot of sugar or caffeine, such as energy drinks, sports drinks, and soda. Avoid alcohol. Avoid spicy or fatty foods.  General instructions Wash your hands often using soap and water for at least 20 seconds. If soap and water are not available, use hand sanitizer. Make sure that everyone in your household washes their hands frequently. Take over-the-counter and prescription medicines only as told by your health care provider. Rest at home while you recover. Watch your condition for any changes. Keep all follow-up visits. This is important. Contact a health care provider if: Your vomiting gets worse. You have new  symptoms. You have a fever. You cannot drink fluids without vomiting. You feel light-headed or dizzy. You have a headache. You have muscle cramps. You have a rash. You have pain while urinating. Get help right away if: You have pain in your chest, neck, arm, or jaw. Your heart is beating very quickly. You have trouble breathing or you are breathing very quickly. You feel extremely weak or you faint. Your skin feels cold and clammy. You feel confused. You have persistent vomiting. You have vomit that is bright red or looks like black coffee grounds. You have stools (feces) that are bloody or black, or stools that look like tar. You have a severe headache, a stiff neck, or both. You have severe pain, cramping, or bloating in your abdomen. You have signs of dehydration, such as: Dark urine, very little urine, or no urine. Cracked lips. Dry mouth. Sunken eyes. Sleepiness. Weakness. These symptoms may be an emergency. Get help right away. Call 911. Do not wait to see if the symptoms will go away. Do not drive yourself to the hospital. Summary Vomiting is when stomach contents forcefully come out of the mouth. Vomiting can cause you to become dehydrated. It is important to treat vomiting as told by your health care provider. Follow your health care provider's instructions about eating and drinking. Wash your hands often using soap and water for at least 20 seconds. If soap and water are not available, use hand sanitizer. Watch your condition for any changes and for signs of dehydration. Keep all follow-up visits. This is important. This information is not intended to replace advice given to you  by your health care provider. Make sure you discuss any questions you have with your health care provider. Document Revised: 06/24/2020 Document Reviewed: 06/24/2020 Elsevier Patient Education  [place].   Follow up with PCP in 4-6 weeks

## 2021-02-27 NOTE — Progress Notes (Signed)
Concerns with frequent diarrhea, vomiting, bloating. Feels like food is stuck on stomach.  Has GI specialist.

## 2021-03-01 ENCOUNTER — Telehealth: Payer: Self-pay

## 2021-03-01 NOTE — Telephone Encounter (Signed)
CALLED PATIENT NO ANSWER LEFT VOICEMAIL FOR A CALL BACK °Letter sent °

## 2021-03-06 ENCOUNTER — Emergency Department: Admission: EM | Admit: 2021-03-06 | Discharge: 2021-03-06 | Discharge status: Against Medical Advice | Payer: 59

## 2021-03-06 ENCOUNTER — Other Ambulatory Visit: Payer: Self-pay

## 2021-03-06 ENCOUNTER — Ambulatory Visit
Admission: EM | Admit: 2021-03-06 | Discharge: 2021-03-06 | Disposition: A | Discharge status: Home / Self Care | Payer: 59 | Attending: Student | Admitting: Student

## 2021-03-06 ENCOUNTER — Ambulatory Visit (INDEPENDENT_AMBULATORY_CARE_PROVIDER_SITE_OTHER): Payer: 59

## 2021-03-06 DIAGNOSIS — R059 Cough, unspecified: Secondary | ICD-10-CM

## 2021-03-06 DIAGNOSIS — Z87891 Personal history of nicotine dependence: Secondary | ICD-10-CM | POA: Diagnosis not present

## 2021-03-06 DIAGNOSIS — R0602 Shortness of breath: Secondary | ICD-10-CM | POA: Diagnosis not present

## 2021-03-06 DIAGNOSIS — U071 COVID-19: Secondary | ICD-10-CM | POA: Insufficient documentation

## 2021-03-06 LAB — RESP PANEL BY RT-PCR (FLU A&B, COVID) ARPGX2
Influenza A by PCR: NEGATIVE
Influenza B by PCR: NEGATIVE
SARS Coronavirus 2 by RT PCR: POSITIVE — AB

## 2021-03-06 MED ORDER — ALBUTEROL SULFATE HFA 108 (90 BASE) MCG/ACT IN AERS: 1.0000 | INHALATION_SPRAY | Freq: Four times a day (QID) | RESPIRATORY_TRACT | 0 refills | Status: DC | PRN

## 2021-03-06 MED ORDER — PROMETHAZINE-DM 6.25-15 MG/5ML PO SYRP: 5.0000 mL | ORAL_SOLUTION | Freq: Four times a day (QID) | ORAL | 0 refills | Status: DC | PRN

## 2021-03-06 MED ORDER — AMOXICILLIN-POT CLAVULANATE 875-125 MG PO TABS: 1.0000 | ORAL_TABLET | Freq: Two times a day (BID) | ORAL | 0 refills | Status: DC

## 2021-03-06 MED ORDER — PREDNISONE 10 MG (21) PO TBPK: ORAL_TABLET | Freq: Every day | ORAL | 0 refills | Status: DC

## 2021-03-06 NOTE — ED Provider Notes (Signed)
[facility]    CSN: [id number] Arrival date & time: 03/06/21  1630      History   Chief Complaint Chief Complaint  Patient presents with   Nasal Congestion    HPI Brian Lutz is a 46 y.o. male presenting with congestion and fevers. History former smoker, DVT. Describes nasal congestion, fevers.  Has not monitored temperature at home.  Cough productive of yellow and green sputum, some associated shortness of breath during coughing episodes.  Chest wall pain with coughing and inspiration.  Denies chest pain at rest, dizziness, weakness.  Exposure to stomach bug but denies nausea, vomiting, diarrhea; tolerating fluids and food.  Exposure to family member who has COVID. Has had the J&J covid vaccine.  HPI  Past Medical History:  Diagnosis Date   Asthma    MRSA (methicillin resistant staph aureus) culture positive     Patient Active Problem List   Diagnosis Date Noted   Abdominal pain, epigastric    Abscess of groin, left 02/05/2015   Nausea 02/05/2015   Hx MRSA infection 02/05/2015   Cellulitis 02/05/2015   Left leg DVT (HCC) 07/27/2014   Acute embolism and thrombosis of deep vein of left lower extremity (HCC) 07/27/2014   Cellulitis of left leg 07/24/2014    Past Surgical History:  Procedure Laterality Date   CHOLECYSTECTOMY     COLONOSCOPY WITH PROPOFOL N/A 12/02/2015   Procedure: COLONOSCOPY WITH PROPOFOL;  Surgeon: Wyline Mood, MD;  Location: [facility];  Service: Endoscopy;  Laterality: N/A;   ESOPHAGOGASTRODUODENOSCOPY (EGD) WITH PROPOFOL N/A 12/02/2015   Procedure: ESOPHAGOGASTRODUODENOSCOPY (EGD) WITH PROPOFOL;  Surgeon: Wyline Mood, MD;  Location: [facility];  Service: Endoscopy;  Laterality: N/A;   INCISION AND DRAINAGE ABSCESS Left 07/26/2014   Procedure: INCISION AND DRAINAGE LEFT LEG ABSCESS;  Surgeon: Ida Rogue, MD;  Location: [facility] ORS;  Service: General;  Laterality: Left;       Home Medications    Prior to Admission  medications   Medication Sig Start Date End Date Taking? Authorizing Provider  albuterol (VENTOLIN HFA) 108 (90 Base) MCG/ACT inhaler Inhale 1-2 puffs into the lungs every 6 (six) hours as needed for wheezing or shortness of breath. 03/06/21  Yes Rhys Martini, PA-C  amoxicillin-clavulanate (AUGMENTIN) 875-125 MG tablet Take 1 tablet by mouth every 12 (twelve) hours. 03/06/21  Yes Rhys Martini, PA-C  famotidine (PEPCID) 20 MG tablet Take by mouth. 07/21/15  Yes [provider]  pantoprazole (PROTONIX) 40 MG tablet Take by mouth. 02/08/15  Yes [provider]  predniSONE (STERAPRED UNI-PAK 21 TAB) 10 MG (21) TBPK tablet Take by mouth daily. Take 6 tabs by mouth daily  for 2 days, then 5 tabs for 2 days, then 4 tabs for 2 days, then 3 tabs for 2 days, 2 tabs for 2 days, then 1 tab by mouth daily for 2 days 03/06/21  Yes Rhys Martini, PA-C  promethazine-dextromethorphan (PROMETHAZINE-DM) 6.25-15 MG/5ML syrup Take 5 mLs by mouth 4 (four) times daily as needed for cough. 03/06/21  Yes Rhys Martini, PA-C  UNABLE TO FIND Med Name: Tylenol with another OTC medication. Last dose: 40981191, 1500.   Yes [provider]  amitriptyline (ELAVIL) 10 MG tablet Take 1 tablet (10 mg total) by mouth at bedtime. 03/28/16 06/28/16  Wyline Mood, MD  SUMAtriptan (IMITREX) 100 MG tablet Take 100 mg by mouth every 2 (two) hours as needed for migraine. May repeat in 2 hours if headache persists or recurs. Patient not  taking: Reported on 02/27/2021    [provider]  traMADol (ULTRAM) 50 MG tablet Take 1 tablet (50 mg total) by mouth every 6 (six) hours as needed. Patient not taking: Reported on 02/27/2021 02/29/20   Faythe Ghee, PA-C    Family History Family History  Problem Relation Age of Onset   Hypertension Mother    Asthma Brother     Social History Social History   Tobacco Use   Smoking status: Former   Smokeless tobacco: Never  Building services engineer Use: Every day    Substances: Flavoring  Substance Use Topics   Alcohol use: No   Drug use: No     Allergies   Patient has no known allergies.   Review of Systems Review of Systems  Constitutional:  Negative for appetite change, chills and fever.  HENT:  Positive for congestion. Negative for ear pain, rhinorrhea, sinus pressure, sinus pain and sore throat.   Eyes:  Negative for redness and visual disturbance.  Respiratory:  Positive for cough and shortness of breath. Negative for chest tightness and wheezing.   Cardiovascular:  Negative for chest pain and palpitations.  Gastrointestinal:  Negative for abdominal pain, constipation, diarrhea, nausea and vomiting.  Genitourinary:  Negative for dysuria, frequency and urgency.  Musculoskeletal:  Negative for myalgias.  Neurological:  Negative for dizziness, weakness and headaches.  Psychiatric/Behavioral:  Negative for confusion.   All other systems reviewed and are negative.   Physical Exam Triage Vital Signs ED Triage Vitals  Enc Vitals Group     BP 03/06/21 1656 122/73     Pulse Rate 03/06/21 1656 89     Resp 03/06/21 1656 20     Temp 03/06/21 1656 (!) 101.9 F (38.8 C)     Temp Source 03/06/21 1656 Oral     SpO2 03/06/21 1656 98 %     Weight 03/06/21 1654 182 lb 15.7 oz (83 kg)     Height 03/06/21 1654 6\' 1"  (1.854 m)     Head Circumference --      Peak Flow --      Pain Score --      Pain Loc --      Pain Edu? --      Excl. in GC? --    No data found.  Updated Vital Signs BP 122/73 (BP Location: Right Arm)   Pulse 89   Temp (!) 101.9 F (38.8 C) (Oral)   Resp 20   Ht 6\' 1"  (1.854 m)   Wt 182 lb 15.7 oz (83 kg)   SpO2 98%   BMI 24.14 kg/m   Visual Acuity Right Eye Distance:   Left Eye Distance:   Bilateral Distance:    Right Eye Near:   Left Eye Near:    Bilateral Near:     Physical Exam Vitals reviewed.  Constitutional:      General: He is not in acute distress.    Appearance: Normal appearance. He is not  ill-appearing.  HENT:     Head: Normocephalic and atraumatic.     Right Ear: Tympanic membrane, ear canal and external ear normal. No tenderness. No middle ear effusion. There is no impacted cerumen. Tympanic membrane is not perforated, erythematous, retracted or bulging.     Left Ear: Tympanic membrane, ear canal and external ear normal. No tenderness.  No middle ear effusion. There is no impacted cerumen. Tympanic membrane is not perforated, erythematous, retracted or bulging.     Nose: Nose normal.  No congestion.     Mouth/Throat:     Mouth: Mucous membranes are moist.     Pharynx: Uvula midline. No oropharyngeal exudate or posterior oropharyngeal erythema.  Eyes:     Extraocular Movements: Extraocular movements intact.     Pupils: Pupils are equal, round, and reactive to light.  Cardiovascular:     Rate and Rhythm: Normal rate and regular rhythm.     Heart sounds: Normal heart sounds.  Pulmonary:     Effort: Pulmonary effort is normal.     Comments: Freq hacking cough. Unable to appreciate breath sounds as he cannot stop coughing. Chest:     Chest wall: Tenderness present.     Comments: Chest wall TTP  Abdominal:     Palpations: Abdomen is soft.     Tenderness: There is no abdominal tenderness. There is no guarding or rebound.  Lymphadenopathy:     Cervical: No cervical adenopathy.     Right cervical: No superficial cervical adenopathy.    Left cervical: No superficial cervical adenopathy.  Neurological:     General: No focal deficit present.     Mental Status: He is alert and oriented to person, place, and time.  Psychiatric:        Mood and Affect: Mood normal.        Behavior: Behavior normal.        Thought Content: Thought content normal.        Judgment: Judgment normal.     UC Treatments / Results  Labs (all labs ordered are listed, but only abnormal results are displayed) Labs Reviewed  RESP PANEL BY RT-PCR (FLU A&B, COVID) ARPGX2 - Abnormal; Notable for the  following components:      Result Value   SARS Coronavirus 2 by RT PCR POSITIVE (*)    All other components within normal limits    EKG   Radiology DG Chest 2 View  Result Date: 03/06/2021 CLINICAL DATA:  Productive cough and shortness of breath, possible COVID-19 infection. EXAM: CHEST - 2 VIEW COMPARISON:  None. FINDINGS: Lungs are adequately inflated without focal airspace consolidation or effusion. Cardiomediastinal silhouette is normal. Bones and soft tissues are normal. IMPRESSION: No active cardiopulmonary disease. Electronically Signed   By: Elberta Fortis M.D.   On: 03/06/2021 17:36    Procedures Procedures (including critical care time)  Medications Ordered in UC Medications - No data to display  Initial Impression / Assessment and Plan / UC Course  I have reviewed the triage vital signs and the nursing notes.  Pertinent labs & imaging results that were available during my care of the patient were reviewed by me and considered in my medical decision making (see chart for details).     This patient is a very pleasant 46 y.o. year old male presenting with covid-19. Febrile but nontachy. Former smoker, no formal diagnosis pulm ds.   CXR - negative.  Has had the J&J covid vaccine. Covid and influenza PCR - covid positive. Not candidate for antiviral given age.   Will manage with prednisone taper, promethazine DM, albuterol. Start augmentin if symptoms do not improve in 2 days. F/u with [place] if symptoms worsen.  ED return precautions discussed. Patient verbalizes understanding and agreement.   Coding Level 4 for acute illness with systemic symptoms, and prescription drug management   Final Clinical Impressions(s) / UC Diagnoses   Final diagnoses:  COVID-19  Former smoker     Discharge Instructions      -Your have covid-19. Your  chest xray looks good right now - you don't have pneumonia yet.  -With a virus, you're typically contagious for 5-7 days, or as long as  you're having fevers.  -Prednisone taper for cough/bronchitis. I recommend taking this in the morning as it could give you energy.  Avoid NSAIDs like ibuprofen and alleve while taking this medication as they can increase your risk of stomach upset and even GI bleeding when in combination with a steroid. You can continue tylenol (acetaminophen) up to 1000mg  3x daily. -Albuterol inhaler as needed for cough, wheezing, shortness of breath, 1 to 2 puffs every 6 hours as needed. -Promethazine DM cough syrup for congestion/cough. This could make you drowsy, so take at night before bed. -IF SYMPTOMS HAVE NOT IMPROVED IN 2 DAYS, OR IF THEY WORSEN: -Start the antibiotic-Augmentin (amoxicillin-clavulanate), 1 pill every 12 hours for 7 days.  You can take this with food like with breakfast and dinner. -Follow-up if symptoms worsen/persist - shortness of breath, new chest pain at rest, coughing up brown or red, etc.       ED Prescriptions     Medication Sig Dispense Auth. Provider   predniSONE (STERAPRED UNI-PAK 21 TAB) 10 MG (21) TBPK tablet Take by mouth daily. Take 6 tabs by mouth daily  for 2 days, then 5 tabs for 2 days, then 4 tabs for 2 days, then 3 tabs for 2 days, 2 tabs for 2 days, then 1 tab by mouth daily for 2 days 42 tablet Rhys Martini, PA-C   albuterol (VENTOLIN HFA) 108 (90 Base) MCG/ACT inhaler Inhale 1-2 puffs into the lungs every 6 (six) hours as needed for wheezing or shortness of breath. 1 each Rhys Martini, PA-C   promethazine-dextromethorphan (PROMETHAZINE-DM) 6.25-15 MG/5ML syrup Take 5 mLs by mouth 4 (four) times daily as needed for cough. 118 mL Ignacia Bayley E, PA-C   amoxicillin-clavulanate (AUGMENTIN) 875-125 MG tablet Take 1 tablet by mouth every 12 (twelve) hours. 14 tablet Rhys Martini, PA-C      PDMP not reviewed this encounter.   Rhys Martini, PA-C 03/06/21 1812

## 2021-03-06 NOTE — Discharge Instructions (Addendum)
-  Your have covid-19. Your chest xray looks good right now - you don't have pneumonia yet.  ?-With a virus, you're typically contagious for 5-7 days, or as long as you're having fevers.  ?-Prednisone taper for cough/bronchitis. I recommend taking this in the morning as it could give you energy.  Avoid NSAIDs like ibuprofen and alleve while taking this medication as they can increase your risk of stomach upset and even GI bleeding when in combination with a steroid. You can continue tylenol (acetaminophen) up to '1000mg'$  3x daily. ?-Albuterol inhaler as needed for cough, wheezing, shortness of breath, 1 to 2 puffs every 6 hours as needed. ?-Promethazine DM cough syrup for congestion/cough. This could make you drowsy, so take at night before bed. ?-IF SYMPTOMS HAVE NOT IMPROVED IN 2 DAYS, OR IF THEY WORSEN: -Start the antibiotic-Augmentin (amoxicillin-clavulanate), 1 pill every 12 hours for 7 days.  You can take this with food like with breakfast and dinner. ?-Follow-up if symptoms worsen/persist - shortness of breath, new chest pain at rest, coughing up brown or red, etc.  ? ?

## 2021-03-06 NOTE — ED Triage Notes (Signed)
Patient is here for "Congestion" & "sharp needles when I breath". Chills, ? Fever. Exposed to niece who is sick as well as someone else with a stomach virus. Symptoms started "x2 days ago". No @ home COVID19 testing.  ?

## 2021-03-08 ENCOUNTER — Encounter: Payer: Self-pay | Admitting: Nurse Practitioner

## 2021-03-08 DIAGNOSIS — R1110 Vomiting, unspecified: Secondary | ICD-10-CM | POA: Insufficient documentation

## 2021-03-08 NOTE — Assessment & Plan Note (Signed)
Bland diet ? ?Stay well hydrated ? ?Will place referral to GI ?

## 2021-03-26 NOTE — Progress Notes (Signed)
Erroneous encounter

## 2021-03-31 ENCOUNTER — Encounter: Payer: 59 | Admitting: Family

## 2021-06-12 ENCOUNTER — Encounter: Payer: Self-pay | Admitting: Gastroenterology

## 2021-06-12 ENCOUNTER — Ambulatory Visit (INDEPENDENT_AMBULATORY_CARE_PROVIDER_SITE_OTHER): Payer: 59 | Admitting: Gastroenterology

## 2021-06-12 VITALS — BP 117/81 | HR 80 | Temp 98.1°F | Ht 69.0 in | Wt 178.8 lb

## 2021-06-12 DIAGNOSIS — R1013 Epigastric pain: Secondary | ICD-10-CM | POA: Diagnosis not present

## 2021-06-12 NOTE — Progress Notes (Signed)
[name] MD, MRCP(U.K) [address]  [place], [place] [address]  Main: [phone]  Fax: [phone]   Gastroenterology Consultation  Referring Provider:     No ref. provider found Primary Care Physician:  Patient, No Pcp Per (Inactive) Primary Gastroenterologist:  Dr. [name]  Reason for Consultation:     Abdominal pain vomiting        HPI:   Brian Lutz is a 46 y.o. y/o male referred for abdominal pain vomiting  He was actually last seen at my office back in 2018 for diarrhea, abdominal pain which had already been going on for 3 to 4 years in duration.  In 2017 a CT scan of the abdomen showed no abnormalities.  He was treated with vancomycin for C. difficile in 2018 he had a colonoscopy performed and tubular adenoma was resected biopsies of the terminal ileum and random colon biopsies were normal.  He was treated with amitriptyline for functional dyspepsia, he did not take it since he said that irritated his stomach he stopped taking his PPIs at that point of time as it did not help him anymore.  At that point of time I suggested he be seen at [facility] since I had tried all the treatments that I could think of.  Review of care everywhere does not show any office visits at [facility].  History of cholecystectomy 10 years back.   Gastric emptying study in 2018 was normal.  He states that since then his symptoms have persisted exactly the same way that they were he did not go back to [facility] or [facility] at that point of time when he was referred.  The reason he is here today is to determine if he has EPI.  He states he has oily stools that are difficult to flush often abdominal discomfort when he eats feels like his food is not digested.  Does not smoke. Past Medical History:  Diagnosis Date   Asthma    MRSA (methicillin resistant staph aureus) culture positive     Past Surgical History:  Procedure Laterality Date   CHOLECYSTECTOMY     COLONOSCOPY WITH  PROPOFOL N/A 12/02/2015   Procedure: COLONOSCOPY WITH PROPOFOL;  Surgeon: [name], MD;  Location: [facility];  Service: Endoscopy;  Laterality: N/A;   ESOPHAGOGASTRODUODENOSCOPY (EGD) WITH PROPOFOL N/A 12/02/2015   Procedure: ESOPHAGOGASTRODUODENOSCOPY (EGD) WITH PROPOFOL;  Surgeon: [name], MD;  Location: [facility];  Service: Endoscopy;  Laterality: N/A;   INCISION AND DRAINAGE ABSCESS Left 07/26/2014   Procedure: INCISION AND DRAINAGE LEFT LEG ABSCESS;  Surgeon: Marlyce Huge, MD;  Location: [facility];  Service: General;  Laterality: Left;    Prior to Admission medications   Medication Sig Start Date End Date Taking? Authorizing Provider  albuterol (VENTOLIN HFA) 108 (90 Base) MCG/ACT inhaler Inhale 1-2 puffs into the lungs every 6 (six) hours as needed for wheezing or shortness of breath. 03/06/21   Hazel Sams, PA-C  amitriptyline (ELAVIL) 10 MG tablet Take 1 tablet (10 mg total) by mouth at bedtime. 03/28/16 06/28/16  [name], MD  amoxicillin-clavulanate (AUGMENTIN) 875-125 MG tablet Take 1 tablet by mouth every 12 (twelve) hours. 03/06/21   Hazel Sams, PA-C  famotidine (PEPCID) 20 MG tablet Take by mouth. 07/21/15   [provider]  pantoprazole (PROTONIX) 40 MG tablet Take by mouth. 02/08/15   [provider]  predniSONE (STERAPRED UNI-PAK 21 TAB) 10 MG (21) TBPK tablet Take by  mouth daily. Take 6 tabs by mouth daily  for 2 days, then 5 tabs for 2 days, then 4 tabs for 2 days, then 3 tabs for 2 days, 2 tabs for 2 days, then 1 tab by mouth daily for 2 days 03/06/21   Hazel Sams, PA-C  promethazine-dextromethorphan (PROMETHAZINE-DM) 6.25-15 MG/5ML syrup Take 5 mLs by mouth 4 (four) times daily as needed for cough. 03/06/21   Hazel Sams, PA-C  SUMAtriptan (IMITREX) 100 MG tablet Take 100 mg by mouth every 2 (two) hours as needed for migraine. May repeat in 2 hours if headache persists or recurs. Patient not taking: Reported on 02/27/2021     [provider]  traMADol (ULTRAM) 50 MG tablet Take 1 tablet (50 mg total) by mouth every 6 (six) hours as needed. Patient not taking: Reported on 02/27/2021 02/29/20   Versie Starks, PA-C  UNABLE TO FIND Med Name: Tylenol with another OTC medication. Last dose: 16109604, 1500.    [provider]    Family History  Problem Relation Age of Onset   Hypertension Mother    Asthma Brother      Social History   Tobacco Use   Smoking status: Former   Smokeless tobacco: Never  Scientific laboratory technician Use: Every day   Substances: Flavoring  Substance Use Topics   Alcohol use: No   Drug use: No    Allergies as of 06/12/2021   (No Known Allergies)    Review of Systems:    All systems reviewed and negative except where noted in HPI.   Physical Exam:  BP 117/81 (BP Location: Left Arm, Patient Position: Sitting, Cuff Size: Normal)   Pulse 80   Temp 98.1 F (36.7 C) (Oral)   Ht '5\' 9"'$  (1.753 m)   Wt 178 lb 12.8 oz (81.1 kg)   BMI 26.40 kg/m  No LMP for male patient. Psych:  Alert and cooperative. Normal mood and affect. General:   Alert,  Well-developed, well-nourished, pleasant and cooperative in NAD Head:  Normocephalic and atraumatic. Neurologic:  Alert and oriented x3;  grossly normal neurologically. Psych:  Alert and cooperative. Normal mood and affect.  Imaging Studies: No results found.  Assessment and Plan:   Brian Lutz is a 46 y.o. y/o male here to see me back after 5 years when he was last seen for a variety of abdominal symptoms predominantly which she has at this point of time as a sensation that his food is not digested and he has oily stools and he feels he could have extra pancreatic insufficiency which I agree is part of the differentials.  I will give him a week supply of pancreatic lipase enzymes to determine if it works and if it does we will try to get him a prescription  Follow up in 4 weeks  Dr [name] MD,MRCP(U.K)

## 2021-06-26 ENCOUNTER — Telehealth: Payer: Self-pay | Admitting: Gastroenterology

## 2021-06-27 NOTE — Telephone Encounter (Addendum)
Patient called stating that the samples that were given helped him. However, he stated that the doctor wanted to give him a certain once but were given another because there weren't any samples to give him. In order words, what medication will you want the patient to take. Patient doesn't know what medication is called. Please advise. I will then send to his pharamacy and see if it's approved or not and if it's not, hopefully appeal.

## 2021-06-28 MED ORDER — PANCRELIPASE (LIP-PROT-AMYL) 36000-114000 UNITS PO CPEP: ORAL_CAPSULE | ORAL | 11 refills | Status: DC

## 2021-06-28 NOTE — Telephone Encounter (Signed)
Prescription was sent to his pharmacy

## 2021-06-28 NOTE — Addendum Note (Signed)
Addended by: Wayna Chalet on: 06/28/2021 09:21 AM   Modules accepted: Orders

## 2021-07-02 ENCOUNTER — Emergency Department
Admission: EM | Admit: 2021-07-02 | Discharge: 2021-07-02 | Disposition: A | Discharge status: Home / Self Care | Payer: 59 | Attending: Emergency Medicine | Admitting: Emergency Medicine

## 2021-07-02 ENCOUNTER — Other Ambulatory Visit: Payer: Self-pay

## 2021-07-02 ENCOUNTER — Emergency Department: Payer: 59

## 2021-07-02 DIAGNOSIS — M50321 Other cervical disc degeneration at C4-C5 level: Secondary | ICD-10-CM | POA: Insufficient documentation

## 2021-07-02 DIAGNOSIS — M21331 Wrist drop, right wrist: Secondary | ICD-10-CM | POA: Insufficient documentation

## 2021-07-02 DIAGNOSIS — M4802 Spinal stenosis, cervical region: Secondary | ICD-10-CM | POA: Insufficient documentation

## 2021-07-02 DIAGNOSIS — Z8614 Personal history of Methicillin resistant Staphylococcus aureus infection: Secondary | ICD-10-CM | POA: Insufficient documentation

## 2021-07-02 DIAGNOSIS — G5631 Lesion of radial nerve, right upper limb: Secondary | ICD-10-CM | POA: Diagnosis not present

## 2021-07-02 DIAGNOSIS — Z86718 Personal history of other venous thrombosis and embolism: Secondary | ICD-10-CM | POA: Diagnosis not present

## 2021-07-02 DIAGNOSIS — R937 Abnormal findings on diagnostic imaging of other parts of musculoskeletal system: Secondary | ICD-10-CM | POA: Insufficient documentation

## 2021-07-02 DIAGNOSIS — R200 Anesthesia of skin: Secondary | ICD-10-CM | POA: Diagnosis present

## 2021-07-02 LAB — CBC
HCT: 34.6 % — ABNORMAL LOW (ref 39.0–52.0)
Hemoglobin: 11.9 g/dL — ABNORMAL LOW (ref 13.0–17.0)
MCH: 29.3 pg (ref 26.0–34.0)
MCHC: 34.4 g/dL (ref 30.0–36.0)
MCV: 85.2 fL (ref 80.0–100.0)
Platelets: 225 10*3/uL (ref 150–400)
RBC: 4.06 MIL/uL — ABNORMAL LOW (ref 4.22–5.81)
RDW: 11.9 % (ref 11.5–15.5)
WBC: 5.4 10*3/uL (ref 4.0–10.5)
nRBC: 0 % (ref 0.0–0.2)

## 2021-07-02 LAB — COMPREHENSIVE METABOLIC PANEL
ALT: 14 U/L (ref 0–44)
AST: 23 U/L (ref 15–41)
Albumin: 4.3 g/dL (ref 3.5–5.0)
Alkaline Phosphatase: 54 U/L (ref 38–126)
Anion gap: 7 (ref 5–15)
BUN: 8 mg/dL (ref 6–20)
CO2: 29 mmol/L (ref 22–32)
Calcium: 8.9 mg/dL (ref 8.9–10.3)
Chloride: 107 mmol/L (ref 98–111)
Creatinine, Ser: 0.89 mg/dL (ref 0.61–1.24)
GFR, Estimated: >60 mL/min (ref >60)
Glucose, Bld: 92 mg/dL (ref 70–99)
Potassium: 3.5 mmol/L (ref 3.5–5.1)
Sodium: 143 mmol/L (ref 135–145)
Total Bilirubin: 0.8 mg/dL (ref 0.3–1.2)
Total Protein: 7.1 g/dL (ref 6.5–8.1)

## 2021-07-02 LAB — DIFFERENTIAL
Abs Immature Granulocytes: 0.02 10*3/uL (ref 0.00–0.07)
Basophils Absolute: 0 10*3/uL (ref 0.0–0.1)
Basophils Relative: 1 %
Eosinophils Absolute: 0.4 10*3/uL (ref 0.0–0.5)
Eosinophils Relative: 7 %
Immature Granulocytes: 0 %
Lymphocytes Relative: 31 %
Lymphs Abs: 1.7 10*3/uL (ref 0.7–4.0)
Monocytes Absolute: 0.4 10*3/uL (ref 0.1–1.0)
Monocytes Relative: 8 %
Neutro Abs: 2.9 10*3/uL (ref 1.7–7.7)
Neutrophils Relative %: 53 %

## 2021-07-02 LAB — APTT: aPTT: 29 seconds (ref 24–36)

## 2021-07-02 LAB — VITAMIN B12: Vitamin B-12: 183 pg/mL (ref 180–914)

## 2021-07-02 LAB — ETHANOL: Alcohol, Ethyl (B): <10 mg/dL (ref <10)

## 2021-07-02 LAB — PROTIME-INR
INR: 1 (ref 0.8–1.2)
Prothrombin Time: 13.4 seconds (ref 11.4–15.2)

## 2021-07-02 NOTE — ED Triage Notes (Signed)
Pt via POV from home. Pt c/o R arm numbness, states that his R wrist also went limp this AM that started around 0300 this AM. Denies any other numbness. Denies pain. Pt is A&Ox4 and NAD Pt states he just recently started on Creon 6 days ago.

## 2021-07-02 NOTE — ED Notes (Addendum)
Pt to ED for R arm numbness and weakness that started this am. Denies any other weakness and numbness in extremities, denies facial parasthesia, denies slurred speech.  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  Pt states he has had multiple staph and mrsa infections in past.  Upon assessment pt unable to move R arm freely, slightly contracted, able to move R fingers with slight movement.

## 2021-07-02 NOTE — Discharge Instructions (Addendum)
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  This causes weakness with range of motion to the wrist and thumb.  You should wear the wrist brace at all times except to bathe and shower.  You should also follow-up with neurology for further evaluation and management.  Your symptoms resolved without surgical intervention.  You should also follow with your primary provider to be referred to occupational (hand) therapy to ensure return of full function to your right hand.

## 2021-07-02 NOTE — ED Provider Notes (Signed)
[facility] Emergency Department Provider Note     Event Date/Time   First MD Initiated Contact with Patient 07/02/21 1533     (approximate)   History   Numbness   HPI  Brian Lutz is a 46 y.o. male with a history of MRSA, DVT, chronic abdominal pain, and extrapancreatic insufficiency, presents to the ED with sudden onset of right upper extremity numbness and weakness.  Patient was of his normal level of health activity when he awoke Saturday morning and describes doing light housework and cooking throughout the day.  Patient has described as he awoke at about 3 AM this morning, he lost ability to extend his wrist.  He would also describe paresthesias from the upper shoulder increasing as it approaches the distal wrist.  He is able to flex and extend the elbow and the shoulder but is unable to extend the thumb, extend the wrist, or extend the fingers distally.  He denies any other weakness, paresthesias, vision loss, slurred speech, or headache.     Physical Exam   Triage Vital Signs: ED Triage Vitals  Enc Vitals Group     BP 07/02/21 1408 (!) 114/91     Pulse Rate 07/02/21 1408 (!) 57     Resp 07/02/21 1408 18     Temp 07/02/21 1408 98 F (36.7 C)     Temp src --      SpO2 07/02/21 1408 98 %     Weight 07/02/21 1404 180 lb (81.6 kg)     Height 07/02/21 1404 '6\' 1"'$  (1.854 m)     Head Circumference --      Peak Flow --      Pain Score 07/02/21 1403 0     Pain Loc --      Pain Edu? --      Excl. in [place]? --     Most recent vital signs: Vitals:   07/02/21 1800 07/02/21 2125  BP: (!) 121/57 117/85  Pulse: (!) 59 85  Resp: 20 18  Temp:    SpO2: 98% 96%    General Awake, no distress.  HEENT NCAT. PERRL. EOMI. No rhinorrhea. Mucous membranes are moist.  CV:  Good peripheral perfusion.  RESP:  Normal effort.  ABD:  No distention.  MSK:  Extremity without obvious deformity, dislocation, or joint effusions.  Patient with decreased composite  fist on the right.  Patient with inability to extend the wrist actively.  No active thumb extension is appreciated.  Patient is able to supinate the forearm without difficulty, and can flex and extend at the elbow without discomfort.  He is able to demonstrate full shoulder extension as well as internal and external rotation of the shoulder without deficit. NEURO: Cranial nerves II through XII grossly intact.  Patient with normal gross sensation to the right upper extremity but does express some decreased sensation from the anterior shoulder, increasing towards the dorsal radial wrist.  DTRs are 2+ and intact on the left upper extremity.  Patient with absent brachioradialis reflex on the right, and diminished bicep and tricep reflexes on the right.   ED Results / Procedures / Treatments   Labs (all labs ordered are listed, but only abnormal results are displayed) Labs Reviewed  CBC - Abnormal; Notable for the following components:      Result Value   RBC 4.06 (*)    Hemoglobin 11.9 (*)    HCT 34.6 (*)    All other components within normal  limits  PROTIME-INR  APTT  DIFFERENTIAL  COMPREHENSIVE METABOLIC PANEL  ETHANOL  RPR  VITAMIN B12  HIV ANTIBODY (ROUTINE TESTING W REFLEX)     EKG  Vent. rate 66 BPM PR interval 128 ms QRS duration 86 ms QT/QTcB 384/402 ms P-R-T axes 50 62 58 NSR Normal axis No STEMI  RADIOLOGY  I personally viewed and evaluated these images as part of my medical decision making, as well as reviewing the written report by the radiologist.  ED Provider Interpretation: abnormal cord signal seen on MRI as reported.   CT Head w/o CM  IMPRESSION: No acute intracranial abnormality.   MRI Cervical Spine w/o CM  IMPRESSION: 1. Abnormal cord signal within the cervical cord extending from the C2 to C7 level. Appearance and distribution are suspicious for subacute combined degeneration of the cord, which can be seen in the setting of vitamin B12  deficiency. Other nutritional and metabolic deficiencies/toxicities can result in a similar appearance. Demyelination and ischemia are considered less likely alternatives. 2. Mild degenerative disc disease at C4-5 and C5-6 resulting in mild canal stenosis and mild bilateral foraminal stenosis at C4-5.   PROCEDURES:  Critical Care performed: No  Procedures   MEDICATIONS ORDERED IN ED: Medications - No data to display   IMPRESSION / MDM / [place] / ED COURSE  I reviewed the triage vital signs and the nursing notes.                              Differential diagnosis includes, but is not limited to, cervical radiculopathy, nerve palsy, CVA, spinal cord compression syndrome  Patient's presentation is most consistent with acute presentation with potential threat to life or bodily function.  ----------------------------------------- 7:25 PM on 07/02/2021 ----------------------------------------- S/W Dr. Tyler [facility]) he will evaluate the patient in the ED after discussing case and imaging.   Patient to the ED for evaluation of sudden onset of wrist weakness upon awakening this morning.  Patient denies any preceding injury, trauma, fall.  Denies any significant EtOH intake.  Patient was evaluated for his complaints with initial stroke work-up which was negative and reassuring.  No lab abnormalities appreciated.  No acute bleed on CT imaging.  Further evaluation with MRI imaging of the cervical spine given his constellation of right upper extremity symptoms, did show some demyelinization and abnormal signal from C3 to to C7 on the right side.  Patient's diagnosis is consistent with acute radial nerve palsy on the right as well as some demyelinization of the cervical spine. Patient will be discharged home with a wrist cock-up splint on the right.  Further lab evaluation is pending at this time but patient is stable for discharge and outpatient follow-up with neurology.   Patient is to follow up with neurology as needed or otherwise directed. Patient is given ED precautions to return to the ED for any worsening or new symptoms.     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    Melvenia Needles, PA-C 07/07/21 Roderic Palau    Duffy Bruce, MD 07/07/21 2218

## 2021-07-02 NOTE — Consult Note (Signed)
Consulting Department: Emergency Department next  Primary Physician:  Patient, No Pcp Per  Chief Complaint: Right upper extremity weakness and numbness  History of Present Illness: 07/02/2021 Brian Lutz is a 46 y.o. male who presents with the chief complaint of right upper extremity weakness and numbness.  He was in his usual state of health until he woke up last night at approximately 3 in the morning and noticed that his right arm felt like it was asleep.  He went back to sleep and woke up in the morning noted that his unable to extend his wrist and had weakness in extending his fingers.  Has some upper extremity numbness and a right-sided radial distribution, additionally has some in the right shoulder and some in the ulnar distribution.  He denies any recent infections, however does feel that he lost his voice recently not having any other symptoms.  He works as a Development worker, community.  Review of Systems:  A 10 point review of systems is negative, except for the pertinent positives and negatives detailed in the HPI.  Past Medical History: Past Medical History:  Diagnosis Date   Asthma    MRSA (methicillin resistant staph aureus) culture positive     Past Surgical History: Past Surgical History:  Procedure Laterality Date   CHOLECYSTECTOMY     COLONOSCOPY WITH PROPOFOL N/A 12/02/2015   Procedure: COLONOSCOPY WITH PROPOFOL;  Surgeon: [name], MD;  Location: [facility] ENDOSCOPY;  Service: Endoscopy;  Laterality: N/A;   ESOPHAGOGASTRODUODENOSCOPY (EGD) WITH PROPOFOL N/A 12/02/2015   Procedure: ESOPHAGOGASTRODUODENOSCOPY (EGD) WITH PROPOFOL;  Surgeon: [name], MD;  Location: [facility] ENDOSCOPY;  Service: Endoscopy;  Laterality: N/A;   INCISION AND DRAINAGE ABSCESS Left 07/26/2014   Procedure: INCISION AND DRAINAGE LEFT LEG ABSCESS;  Surgeon: Marlyce Huge, MD;  Location: [facility] ORS;  Service: General;  Laterality: Left;    Allergies: Allergies as of 07/02/2021   (No Known Allergies)     Medications: No current facility-administered medications for this encounter.  Current Outpatient Medications:    albuterol (VENTOLIN HFA) 108 (90 Base) MCG/ACT inhaler, Inhale 1-2 puffs into the lungs every 6 (six) hours as needed for wheezing or shortness of breath. (Patient not taking: Reported on 06/12/2021), Disp: 1 each, Rfl: 0   amitriptyline (ELAVIL) 10 MG tablet, Take 1 tablet (10 mg total) by mouth at bedtime., Disp: 30 tablet, Rfl: 3   amoxicillin-clavulanate (AUGMENTIN) 875-125 MG tablet, Take 1 tablet by mouth every 12 (twelve) hours. (Patient not taking: Reported on 06/12/2021), Disp: 14 tablet, Rfl: 0   famotidine (PEPCID) 20 MG tablet, Take by mouth. (Patient not taking: Reported on 06/12/2021), Disp: , Rfl:    lipase/protease/amylase (CREON) 36000 UNITS CPEP capsule, Take 2 capsules (72,000 Units total) by mouth 3 (three) times daily with meals. May also take 1 capsule (36,000 Units total) as needed (with snacks)., Disp: 240 capsule, Rfl: 11   pantoprazole (PROTONIX) 40 MG tablet, Take by mouth. (Patient not taking: Reported on 06/12/2021), Disp: , Rfl:    predniSONE (STERAPRED UNI-PAK 21 TAB) 10 MG (21) TBPK tablet, Take by mouth daily. Take 6 tabs by mouth daily  for 2 days, then 5 tabs for 2 days, then 4 tabs for 2 days, then 3 tabs for 2 days, 2 tabs for 2 days, then 1 tab by mouth daily for 2 days (Patient not taking: Reported on 06/12/2021), Disp: 42 tablet, Rfl: 0   promethazine-dextromethorphan (PROMETHAZINE-DM) 6.25-15 MG/5ML syrup, Take 5 mLs by mouth 4 (four) times daily as needed for  cough. (Patient not taking: Reported on 06/12/2021), Disp: 118 mL, Rfl: 0   SUMAtriptan (IMITREX) 100 MG tablet, Take 100 mg by mouth every 2 (two) hours as needed for migraine. May repeat in 2 hours if headache persists or recurs. (Patient not taking: Reported on 02/27/2021), Disp: , Rfl:    traMADol (ULTRAM) 50 MG tablet, Take 1 tablet (50 mg total) by mouth every 6 (six) hours as needed.  (Patient not taking: Reported on 02/27/2021), Disp: 15 tablet, Rfl: 0   UNABLE TO FIND, Med Name: Tylenol with another OTC medication. Last dose: [id number], 1500. (Patient not taking: Reported on 06/12/2021), Disp: , Rfl:    Social History: Social History   Tobacco Use   Smoking status: Former   Smokeless tobacco: Never  Scientific laboratory technician Use: Every day   Substances: Flavoring  Substance Use Topics   Alcohol use: No   Drug use: No    Family Medical History: Family History  Problem Relation Age of Onset   Hypertension Mother    Asthma Brother     Physical Examination: Vitals:   07/02/21 1408 07/02/21 1800  BP: (!) 114/91 (!) 121/57  Pulse: (!) 57 (!) 59  Resp: 18 20  Temp: 98 F (36.7 C)   SpO2: 98% 98%     General: Patient is well developed, well nourished, calm, collected, and in no apparent distress.  NEUROLOGICAL:  General: In no acute distress.   Awake, alert, oriented to person, place, and time.  Pupils equal round and reactive to light.  Facial tone is symmetric.  Tongue protrusion is midline.  There is no pronator drift.  Strength: Right upper extremity demonstrates weakness in his radial anteverted musculature distal to his triceps.  His deltoid trapezius and spinatus function are full strength.  Biceps and triceps are full strength.  Pronation is full, supination is functional.  Wrist flexion is full.  Ulnar intrinsic and median intrinsic function is full.  He is unable to extend his wrist.  He is able to extend his fingers at the PIPs but not at the MCPs or DIPs.  Severe decrease in the radial nerve distribution on the right distal to the mid upper arm, some slight decrease in sensation in his shoulder and ulnar distribution, however much less severe in comparison to his radial nerve.  Reflexes are 2+ at the biceps and triceps, however negative at the brachial radialis.  Clonus is not present.  Toes are down-going.    Imaging: Narrative & Impression   CLINICAL DATA:  RUE weakness/wrist drop   EXAM: MRI CERVICAL SPINE WITHOUT CONTRAST   TECHNIQUE: Multiplanar, multisequence MR imaging of the cervical spine was performed. No intravenous contrast was administered.   COMPARISON:  CT 05/06/2009   FINDINGS: Alignment: Physiologic.   Vertebrae: No fracture, evidence of discitis, or bone lesion.   Cord: High T2/STIR signal within the cervical cord extending from the C2 to C7 level. Signal abnormality is predominantly central and posterior in location (series 8, image 9; series 5, image 8). No expansile cord lesion. Findings do not appear to represent a syrinx. No abnormality within the epidural space.   Posterior Fossa, vertebral arteries, paraspinal tissues: Negative.   Disc levels:   C2-C3: No significant disc protrusion, foraminal stenosis, or canal stenosis.   C3-C4: No significant disc protrusion, foraminal stenosis, or canal stenosis.   C4-C5: Mild disc osteophyte complex resulting in slight impress upon the ventral thecal sac resulting in mild canal stenosis and mild bilateral foraminal  stenosis.   C5-C6: Shallow right paracentral disc protrusion with annular fissure. Borderline-mild canal stenosis. No foraminal stenosis.   C6-C7: No significant disc protrusion, foraminal stenosis, or canal stenosis.   C7-T1: No significant disc protrusion, foraminal stenosis, or canal stenosis.   IMPRESSION: 1. Abnormal cord signal within the cervical cord extending from the C2 to C7 level. Appearance and distribution are suspicious for subacute combined degeneration of the cord, which can be seen in the setting of vitamin B12 deficiency. Other nutritional and metabolic deficiencies/toxicities can result in a similar appearance. Demyelination and ischemia are considered less likely alternatives. 2. Mild degenerative disc disease at C4-5 and C5-6 resulting in mild canal stenosis and mild bilateral foraminal stenosis at C4-5.      Electronically Signed   By: Davina Poke D.O.   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   Labs:    Latest Ref Rng & Units 07/02/2021    2:10 PM 05/27/2019    6:42 PM 09/25/2016   12:50 PM  CBC  WBC 4.0 - 10.5 K/uL 5.4  8.3  8.0   Hemoglobin 13.0 - 17.0 g/dL 11.9  14.4  15.8   Hematocrit 39.0 - 52.0 % 34.6  41.1  44.6   Platelets 150 - 400 K/uL 225  294  230        Assessment and Plan: Brian Lutz is a pleasant 46 y.o. male with with a history of pancreatic insufficiency who woke up from sleep with a severe wrist and finger drop.  He had little pain but did have severe sensory changes.  He went back to sleep at 3 AM hoping that it would resolve by the morning which it did not.  He did not notice any trauma.  On physical exam he has a radial neuropathy which localizes to the spiral groove given positive Tinel's sign intact tricep strength, weak wrist extension and finger extension.  His ulnar intrinsics and median intrinsics are strong, as well as his proximal musculature.  Given hits onset immediately upon waking this is likely a compressive radial nerve palsy note also has a Saturday night palsy.  Given his patchy involvement of his axillary sensory dermatome as well as some of his ulnar dermatome, this could also represent a inflammatory neuropathy.  He did have an MRI which demonstrated a central/posterior lesion extending multiple cervical spine levels.  Given his history of pancreatic insufficiency and the location of this finding there was some concern for possible subacute combined degeneration.  He did not have hyperreflexia or lower extremity symptoms.  He had some agent imaging characteristics consistent with a syrinx again he did not have any lower extremity symptoms.  Given that posterior column issues are in the differential would consider B12 testing and syphilis  testing for possible tabes dorsalis.  Would/will defer the rest of the work-up to our colleagues in neurology, if they are not available please make a urgent consult.  We will also plan to follow him as an outpatient.  He should have occupational therapy to prevent any contractions.  I have discussed the condition with the patient, including showing the radiographs and discussing treatment options in layman's terms.  The patient may benefit from conservative management.   Stevan Born, MD/MSCR Dept. of Neurosurgery

## 2021-07-03 LAB — RPR: RPR Ser Ql: NONREACTIVE

## 2021-07-03 LAB — HIV ANTIBODY (ROUTINE TESTING W REFLEX): HIV Screen 4th Generation wRfx: NONREACTIVE

## 2021-07-13 ENCOUNTER — Ambulatory Visit: Payer: 59 | Admitting: Gastroenterology

## 2021-07-13 NOTE — Progress Notes (Deleted)
[name] MD, MRCP(U.K) [address]  [place]  [place], [place] [address]  Main: [phone]  Fax: [phone]   Primary Care Physician: Patient, No Pcp Per  Primary Gastroenterologist:  Dr. [name]   No chief complaint on file.   HPI: Brian Lutz is a 46 y.o. male   Summary of history :   seen at my office back in 2018 for diarrhea, abdominal pain which had already been going on for 3 to 4 years in duration.  In 2017 a CT scan of the abdomen showed no abnormalities.  He was treated with vancomycin for C. difficile in 2018 he had a colonoscopy performed and tubular adenoma was resected biopsies of the terminal ileum and random colon biopsies were normal.  He was treated with amitriptyline for functional dyspepsia, he did not take it since he said that irritated his stomach he stopped taking his PPIs at that point of time as it did not help him anymore.  At that point of time I suggested he be seen at [facility] since I had tried all the treatments that I could think of.  Review of care everywhere does not show any office visits at [facility].  History of cholecystectomy 10 years back.     Gastric emptying study in 2018 was normal.   He states that since then his symptoms have persisted exactly the same way that they were he did not go back to [facility] or [facility] at that point of time when he was referred.  The reason he is here today is to determine if he has EPI.  He states he has oily stools that are difficult to flush often abdominal discomfort when he eats feels like his food is not digested.  Does not smoke.   Interval history   06/12/2021-07/13/2021    ***   Current Outpatient Medications  Medication Sig Dispense Refill   amitriptyline (ELAVIL) 10 MG tablet Take 1 tablet (10 mg total) by mouth at bedtime. 30 tablet 3   famotidine (PEPCID) 20 MG tablet Take by mouth. (Patient not taking: Reported on 06/12/2021)     lipase/protease/amylase (CREON) 36000 UNITS  CPEP capsule Take 2 capsules (72,000 Units total) by mouth 3 (three) times daily with meals. May also take 1 capsule (36,000 Units total) as needed (with snacks). 240 capsule 11   No current facility-administered medications for this visit.    Allergies as of 07/13/2021   (No Known Allergies)    ROS:  General: Negative for anorexia, weight loss, fever, chills, fatigue, weakness. ENT: Negative for hoarseness, difficulty swallowing , nasal congestion. CV: Negative for chest pain, angina, palpitations, dyspnea on exertion, peripheral edema.  Respiratory: Negative for dyspnea at rest, dyspnea on exertion, cough, sputum, wheezing.  GI: See history of present illness. GU:  Negative for dysuria, hematuria, urinary incontinence, urinary frequency, nocturnal urination.  Endo: Negative for unusual weight change.    Physical Examination:   There were no vitals taken for this visit.  General: Well-nourished, well-developed in no acute distress.  Eyes: No icterus. Conjunctivae pink. Mouth: Oropharyngeal mucosa moist and pink , no lesions erythema or exudate. Lungs: Clear to auscultation bilaterally. Non-labored. Heart: Regular rate and rhythm, no murmurs rubs or gallops.  Abdomen: Bowel sounds are normal, nontender, nondistended, no hepatosplenomegaly or masses, no abdominal bruits or hernia , no rebound or guarding.   Extremities: No lower extremity edema. No clubbing or deformities. Neuro: Alert and oriented x 3.  Grossly intact. Skin: Warm and dry, no jaundice.   Psych: Alert and cooperative, normal mood and affect.   Imaging Studies: MR Cervical Spine Wo Contrast  Result Date: 07/02/2021 CLINICAL DATA:  RUE weakness/wrist drop EXAM: MRI CERVICAL SPINE WITHOUT CONTRAST TECHNIQUE: Multiplanar, multisequence MR imaging of the cervical spine was performed. No intravenous contrast was administered. COMPARISON:  CT 05/06/2009 FINDINGS: Alignment: Physiologic. Vertebrae: No fracture, evidence of  discitis, or bone lesion. Cord: High T2/STIR signal within the cervical cord extending from the C2 to C7 level. Signal abnormality is predominantly central and posterior in location (series 8, image 9; series 5, image 8). No expansile cord lesion. Findings do not appear to represent a syrinx. No abnormality within the epidural space. Posterior Fossa, vertebral arteries, paraspinal tissues: Negative. Disc levels: C2-C3: No significant disc protrusion, foraminal stenosis, or canal stenosis. C3-C4: No significant disc protrusion, foraminal stenosis, or canal stenosis. C4-C5: Mild disc osteophyte complex resulting in slight impress upon the ventral thecal sac resulting in mild canal stenosis and mild bilateral foraminal stenosis. C5-C6: Shallow right paracentral disc protrusion with annular fissure. Borderline-mild canal stenosis. No foraminal stenosis. C6-C7: No significant disc protrusion, foraminal stenosis, or canal stenosis. C7-T1: No significant disc protrusion, foraminal stenosis, or canal stenosis. IMPRESSION: 1. Abnormal cord signal within the cervical cord extending from the C2 to C7 level. Appearance and distribution are suspicious for subacute combined degeneration of the cord, which can be seen in the setting of vitamin B12 deficiency. Other nutritional and metabolic deficiencies/toxicities can result in a similar appearance. Demyelination and ischemia are considered less likely alternatives. 2. Mild degenerative disc disease at C4-5 and C5-6 resulting in mild canal stenosis and mild bilateral foraminal stenosis at C4-5. Electronically Signed   By: Davina Poke D.O.   On: 07/02/2021 19:04   CT HEAD WO CONTRAST  Result Date: 07/02/2021 CLINICAL DATA:  Right arm numbness EXAM: CT HEAD WITHOUT CONTRAST TECHNIQUE: Contiguous axial images were obtained from the base of the skull through the vertex without intravenous contrast. RADIATION DOSE REDUCTION: This exam was performed according to the departmental  dose-optimization program which includes automated exposure control, adjustment of the mA and/or kV according to patient size and/or use of iterative reconstruction technique. COMPARISON:  04/24/2020 FINDINGS: Brain: No evidence of acute infarction, hemorrhage, hydrocephalus, extra-axial collection or mass lesion/mass effect. Vascular: No hyperdense vessel or unexpected calcification. Skull: Normal. Negative for fracture or focal lesion. Sinuses/Orbits: No acute finding. Other: None. IMPRESSION: No acute intracranial abnormality. Electronically Signed   By: Davina Poke D.O.   On: 07/02/2021 14:54    Assessment and Plan:   LEGION DISCHER is a 46 y.o. y/o male here to see me back after 5 years when he was last seen for a variety of abdominal symptoms predominantly which she has at this point of time as a sensation that his food is not digested and he has oily stools and he feels he could have extra pancreatic insufficiency which I agree is part of the differentials.  I will give him a week supply of pancreatic lipase enzymes to determine if it works and if it does we will try to get him a prescription  Dr [name]  MD,MRCP [facility]) Follow up in ***

## 2021-09-14 ENCOUNTER — Ambulatory Visit: Payer: Self-pay | Admitting: Neurology

## 2021-12-02 ENCOUNTER — Other Ambulatory Visit: Payer: Self-pay | Admitting: Gastroenterology

## 2021-12-11 ENCOUNTER — Other Ambulatory Visit: Payer: Self-pay | Admitting: Gastroenterology

## 2021-12-12 ENCOUNTER — Other Ambulatory Visit: Payer: Self-pay | Admitting: Gastroenterology

## 2021-12-12 ENCOUNTER — Other Ambulatory Visit: Payer: Self-pay

## 2021-12-12 MED ORDER — PANCRELIPASE (LIP-PROT-AMYL) 36000-114000 UNITS PO CPEP: ORAL_CAPSULE | ORAL | 11 refills | Status: DC

## 2021-12-13 ENCOUNTER — Other Ambulatory Visit: Payer: Self-pay | Admitting: Gastroenterology

## 2021-12-14 NOTE — Telephone Encounter (Signed)
Yes as long visit with the same dose

## 2022-01-02 ENCOUNTER — Telehealth: Payer: Self-pay

## 2022-01-02 NOTE — Telephone Encounter (Signed)
Patient called stating that he needed a refill on his Pancreaze and that it needed prior-authorization. He then stated that his insurance had changed and that it was [place] now. I asked him for the information and stated that he did not have it with him at the moment. However, when I tried to do a prior-authorization, of course it asked me for his ID number. I then called patient and he would not answer any of the numbers we have in the system. Now I have to wait until he calls back again to make sure we have the correct number.

## 2022-02-10 ENCOUNTER — Emergency Department
Admission: EM | Admit: 2022-02-10 | Discharge: 2022-02-10 | Discharge status: Against Medical Advice | Payer: Commercial Managed Care - HMO | Attending: Emergency Medicine | Admitting: Emergency Medicine

## 2022-02-10 DIAGNOSIS — Z20822 Contact with and (suspected) exposure to covid-19: Secondary | ICD-10-CM | POA: Insufficient documentation

## 2022-02-10 DIAGNOSIS — J101 Influenza due to other identified influenza virus with other respiratory manifestations: Secondary | ICD-10-CM | POA: Insufficient documentation

## 2022-02-10 DIAGNOSIS — Z5321 Procedure and treatment not carried out due to patient leaving prior to being seen by health care provider: Secondary | ICD-10-CM | POA: Diagnosis not present

## 2022-02-10 DIAGNOSIS — R0981 Nasal congestion: Secondary | ICD-10-CM | POA: Diagnosis present

## 2022-02-10 LAB — RESP PANEL BY RT-PCR (RSV, FLU A&B, COVID)  RVPGX2
Influenza A by PCR: POSITIVE — AB
Influenza B by PCR: NEGATIVE
Resp Syncytial Virus by PCR: NEGATIVE
SARS Coronavirus 2 by RT PCR: NEGATIVE

## 2022-02-18 ENCOUNTER — Encounter (HOSPITAL_COMMUNITY): Payer: Self-pay | Admitting: Emergency Medicine

## 2022-02-18 ENCOUNTER — Emergency Department (HOSPITAL_COMMUNITY): Payer: Commercial Managed Care - HMO

## 2022-02-18 ENCOUNTER — Emergency Department (HOSPITAL_COMMUNITY)
Admission: EM | Admit: 2022-02-18 | Discharge: 2022-02-18 | Disposition: A | Discharge status: Home / Self Care | Payer: Commercial Managed Care - HMO | Attending: Emergency Medicine | Admitting: Emergency Medicine

## 2022-02-18 DIAGNOSIS — J181 Lobar pneumonia, unspecified organism: Secondary | ICD-10-CM | POA: Insufficient documentation

## 2022-02-18 DIAGNOSIS — J189 Pneumonia, unspecified organism: Secondary | ICD-10-CM | POA: Diagnosis not present

## 2022-02-18 DIAGNOSIS — Z1152 Encounter for screening for COVID-19: Secondary | ICD-10-CM | POA: Insufficient documentation

## 2022-02-18 DIAGNOSIS — J45909 Unspecified asthma, uncomplicated: Secondary | ICD-10-CM | POA: Diagnosis not present

## 2022-02-18 DIAGNOSIS — R059 Cough, unspecified: Secondary | ICD-10-CM | POA: Diagnosis present

## 2022-02-18 LAB — RESP PANEL BY RT-PCR (RSV, FLU A&B, COVID)  RVPGX2
Influenza A by PCR: NEGATIVE
Influenza B by PCR: NEGATIVE
Resp Syncytial Virus by PCR: NEGATIVE
SARS Coronavirus 2 by RT PCR: NEGATIVE

## 2022-02-18 MED ORDER — AMOXICILLIN 500 MG PO CAPS
500.0000 mg | ORAL_CAPSULE | Freq: Once | ORAL | Status: AC
Start: 2022-02-18 — End: 2022-02-18
  Administered 2022-02-18: 500 mg via ORAL
  Filled 2022-02-18: qty 1

## 2022-02-18 MED ORDER — AZITHROMYCIN 250 MG PO TABS: 250.0000 mg | ORAL_TABLET | Freq: Every day | ORAL | 0 refills | Status: AC

## 2022-02-18 MED ORDER — AMOXICILLIN 500 MG PO CAPS: 500.0000 mg | ORAL_CAPSULE | Freq: Three times a day (TID) | ORAL | 0 refills | Status: AC

## 2022-02-18 MED ORDER — AZITHROMYCIN 250 MG PO TABS
500.0000 mg | ORAL_TABLET | Freq: Once | ORAL | Status: AC
  Administered 2022-02-18: 500 mg via ORAL
  Filled 2022-02-18: qty 2

## 2022-02-18 NOTE — ED Provider Notes (Signed)
[facility] Provider Note   CSN: [id number] Arrival date & time: 02/18/22  1628     History  Chief Complaint  Patient presents with   Cough    Brian Lutz is a 47 y.o. male.  Patient presents the emergency room complaining of cough which has been persistent since the flu diagnosis which was approximately 8 days ago.  He denies fevers, abdominal pain, nausea, vomiting.  Endorses cough which has been persistent.  Past medical history significant for asthma, MRSA infection, DVT history  HPI     Home Medications Prior to Admission medications   Medication Sig Start Date End Date Taking? Authorizing Provider  amoxicillin (AMOXIL) 500 MG capsule Take 1 capsule (500 mg total) by mouth 3 (three) times daily. 02/18/22  Yes Dorothyann Peng, PA-C  azithromycin (ZITHROMAX) 250 MG tablet Take 1 tablet (250 mg total) by mouth daily. Take first 2 tablets together, then 1 every day until finished. 02/18/22  Yes Dorothyann Peng, PA-C  amitriptyline (ELAVIL) 10 MG tablet Take 1 tablet (10 mg total) by mouth at bedtime. 03/28/16 06/28/16  [name], MD  famotidine (PEPCID) 20 MG tablet Take by mouth. Patient not taking: Reported on 06/12/2021 07/21/15   [provider]  Pancrelipase, Lip-Prot-Amyl, (PANCREAZE) 2600-8800 units CPEP TAKE 2 CAPSULES BY MOUTH 3 TIMES A DAY WITH MEALS ALSO TAKE 1 CAP AS NEEDED WITH SNACK 12/22/21   [name], MD      Allergies    Patient has no known allergies.    Review of Systems   Review of Systems  Respiratory:  Positive for cough.     Physical Exam Updated Vital Signs BP 119/89   Pulse 86   Temp 98.5 F (36.9 C) (Oral)   Resp 18   SpO2 95%  Physical Exam Vitals and nursing note reviewed.  Constitutional:      General: He is not in acute distress.    Appearance: He is well-developed.  HENT:     Head: Normocephalic and atraumatic.  Eyes:     Conjunctiva/sclera: Conjunctivae normal.   Cardiovascular:     Rate and Rhythm: Normal rate and regular rhythm.     Heart sounds: No murmur heard. Pulmonary:     Effort: Pulmonary effort is normal. No respiratory distress.     Comments: Mildly diminished lung sounds in the left lower lobe Abdominal:     Palpations: Abdomen is soft.     Tenderness: There is no abdominal tenderness.  Musculoskeletal:        General: No swelling.     Cervical back: Neck supple.  Skin:    General: Skin is warm and dry.     Capillary Refill: Capillary refill takes less than 2 seconds.  Neurological:     Mental Status: He is alert.  Psychiatric:        Mood and Affect: Mood normal.     ED Results / Procedures / Treatments   Labs (all labs ordered are listed, but only abnormal results are displayed) Labs Reviewed  RESP PANEL BY RT-PCR (RSV, FLU A&B, COVID)  [id number]    EKG None  Radiology DG Chest 2 View  Result Date: 02/18/2022 CLINICAL DATA:  47 year old male with history of shortness of breath and cough for the past 10 days. EXAM: CHEST - 2 VIEW COMPARISON:  Chest x-ray 03/06/2021. FINDINGS: Consolidation in the medial aspect of the left lower lobe. Right lung is clear. No pleural effusions. No  pneumothorax. No suspicious appearing pulmonary nodules or masses are noted. No evidence of pulmonary edema. Heart size is normal. Upper mediastinal contours are within normal limits. IMPRESSION: 1. Left lower lobe pneumonia. Followup PA and lateral chest X-ray is recommended in 3-4 weeks following trial of antibiotic therapy to ensure resolution and exclude underlying malignancy. Electronically Signed   By: Vinnie Langton M.D.   On: 02/18/2022 17:04    Procedures Procedures    Medications Ordered in ED Medications  amoxicillin (AMOXIL) capsule 500 mg (500 mg Oral Given 02/18/22 1814)  azithromycin (ZITHROMAX) tablet 500 mg (500 mg Oral Given 02/18/22 1814)    ED Course/ Medical Decision Making/ A&P                             Medical  Decision Making Amount and/or Complexity of Data Reviewed Radiology: ordered.  Risk Prescription drug management.   This patient presents to the ED for concern of cough, this involves an extensive number of treatment options, and is a complaint that carries with it a high risk of complications and morbidity.  The differential diagnosis includes pneumonia, COVID-19, influenza, viral illness, and others   Co morbidities that complicate the patient evaluation  History of recent influenza infection   Additional history obtained:   External records from outside source obtained and reviewed including respiratory panel results from February 10 with the patient was positive for influenza A   Lab Tests:  I Ordered, and personally interpreted labs.  The pertinent results include: Respiratory panel pending   Imaging Studies ordered:  I ordered imaging studies including chest x-ray I independently visualized and interpreted imaging which showed left lower lobe pneumonia I agree with the radiologist interpretation   Problem List / ED Course / Critical interventions / Medication management   I ordered medication including amoxicillin and azithromycin for pneumonia Reevaluation of the patient after these medicines showed that the patient stayed the same I have reviewed the patients home medicines and have made adjustments as needed   Social Determinants of Health:  Patient currently has no primary care provider   Test / Admission - Considered:  The patient's vitals are all within normal limits.  He is afebrile.  No signs of sepsis.  The patient does have a left lower lobe pneumonia on chest x-ray.  Plan to discharge home with prescriptions for amoxicillin and azithromycin for community-acquired pneumonia coverage.  Patient instructed to follow-up in 3 to 4 weeks for repeat chest x-ray.  Patient also instructed to follow-up if symptoms do not subside after antibiotic coverage.   Patient instructed on using numbers within discharge paperwork to help arrange primary care follow-up.  Return precautions provided        Final Clinical Impression(s) / ED Diagnoses Final diagnoses:  Community acquired pneumonia of left lower lobe of lung    Rx / DC Orders ED Discharge Orders          Ordered    azithromycin (ZITHROMAX) 250 MG tablet  Daily        02/18/22 1836    amoxicillin (AMOXIL) 500 MG capsule  3 times daily        02/18/22 1836              Ronny Bacon 02/18/22 1837    Cristie Hem, MD 02/18/22 2340

## 2022-02-18 NOTE — Discharge Instructions (Addendum)
You were diagnosed today with a left lower lobe pneumonia.  Please take the prescribed antibiotics until they are completed.  If you are feeling better I recommend follow-up with her primary care provider in 3 to 4 weeks for a repeat chest x-ray.  If you continue to feel unwell after the completion of the antibiotics please seek immediate follow-up for possible change in therapy or continuation of antibiotics.  If you develop any life-threatening symptoms please return to the emergency department.

## 2022-02-18 NOTE — ED Triage Notes (Signed)
Pt here with c/o cough and headache , had the flu 8 days ago and is just not feeling any better

## 2022-04-13 ENCOUNTER — Other Ambulatory Visit: Payer: Self-pay

## 2022-04-13 ENCOUNTER — Emergency Department: Payer: Medicaid Other

## 2022-04-13 ENCOUNTER — Encounter: Payer: Self-pay | Admitting: Intensive Care

## 2022-04-13 ENCOUNTER — Emergency Department
Admission: EM | Admit: 2022-04-13 | Discharge: 2022-04-13 | Disposition: A | Discharge status: Home / Self Care | Payer: Medicaid Other | Attending: Emergency Medicine | Admitting: Emergency Medicine

## 2022-04-13 DIAGNOSIS — M7989 Other specified soft tissue disorders: Secondary | ICD-10-CM

## 2022-04-13 DIAGNOSIS — L03115 Cellulitis of right lower limb: Secondary | ICD-10-CM | POA: Diagnosis not present

## 2022-04-13 DIAGNOSIS — L539 Erythematous condition, unspecified: Secondary | ICD-10-CM | POA: Diagnosis present

## 2022-04-13 LAB — COMPREHENSIVE METABOLIC PANEL
ALT: 28 U/L (ref 0–44)
AST: 33 U/L (ref 15–41)
Albumin: 4.1 g/dL (ref 3.5–5.0)
Alkaline Phosphatase: 89 U/L (ref 38–126)
Anion gap: 10 (ref 5–15)
BUN: 9 mg/dL (ref 6–20)
CO2: 28 mmol/L (ref 22–32)
Calcium: 8.7 mg/dL — ABNORMAL LOW (ref 8.9–10.3)
Chloride: 100 mmol/L (ref 98–111)
Creatinine, Ser: 0.76 mg/dL (ref 0.61–1.24)
GFR, Estimated: >60 mL/min (ref >60)
Glucose, Bld: 113 mg/dL — ABNORMAL HIGH (ref 70–99)
Potassium: 3.5 mmol/L (ref 3.5–5.1)
Sodium: 138 mmol/L (ref 135–145)
Total Bilirubin: 0.7 mg/dL (ref 0.3–1.2)
Total Protein: 7.5 g/dL (ref 6.5–8.1)

## 2022-04-13 LAB — CBC WITH DIFFERENTIAL/PLATELET
Abs Immature Granulocytes: 0.03 10*3/uL (ref 0.00–0.07)
Basophils Absolute: 0 10*3/uL (ref 0.0–0.1)
Basophils Relative: 0 %
Eosinophils Absolute: 0.5 10*3/uL (ref 0.0–0.5)
Eosinophils Relative: 5 %
HCT: 33.9 % — ABNORMAL LOW (ref 39.0–52.0)
Hemoglobin: 11.8 g/dL — ABNORMAL LOW (ref 13.0–17.0)
Immature Granulocytes: 0 %
Lymphocytes Relative: 22 %
Lymphs Abs: 1.8 10*3/uL (ref 0.7–4.0)
MCH: 30.3 pg (ref 26.0–34.0)
MCHC: 34.8 g/dL (ref 30.0–36.0)
MCV: 86.9 fL (ref 80.0–100.0)
Monocytes Absolute: 0.7 10*3/uL (ref 0.1–1.0)
Monocytes Relative: 9 %
Neutro Abs: 5.3 10*3/uL (ref 1.7–7.7)
Neutrophils Relative %: 64 %
Platelets: 246 10*3/uL (ref 150–400)
RBC: 3.9 MIL/uL — ABNORMAL LOW (ref 4.22–5.81)
RDW: 12.5 % (ref 11.5–15.5)
WBC: 8.4 10*3/uL (ref 4.0–10.5)
nRBC: 0 % (ref 0.0–0.2)

## 2022-04-13 MED ORDER — DOXYCYCLINE MONOHYDRATE 100 MG PO TABS: 100.0000 mg | ORAL_TABLET | Freq: Two times a day (BID) | ORAL | 0 refills | Status: AC

## 2022-04-13 MED ORDER — CEPHALEXIN 500 MG PO CAPS: 500.0000 mg | ORAL_CAPSULE | Freq: Four times a day (QID) | ORAL | 0 refills | Status: AC

## 2022-04-13 NOTE — ED Provider Notes (Signed)
[facility] Provider Note    Event Date/Time   First MD Initiated Contact with Patient 04/13/22 1511     (approximate)   History   Abscess   HPI  Brian Lutz is a 47 y.o. male with a past medical history of DVT, MRSA, who presents today for evaluation of erythema to his right lower extremity.  Patient reports that he had a pimple to his anterior lower leg that he tried to pop and now there is surrounding erythema.  He reports that his whole leg is swollen as well.  No fevers or chills.  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 --      Excl. in GC? --     Most recent vital signs: Vitals:   04/13/22 1424  BP: 127/80  Pulse: 75  Resp: 18  Temp: 98.7 F (37.1 C)  SpO2: 100%    Physical Exam Vitals and nursing note reviewed.  Constitutional:      General: Awake and alert. No acute distress.    Appearance: Normal appearance. The patient is normal weight.  HENT:     Head: Normocephalic and atraumatic.     Mouth: Mucous membranes are moist.  Eyes:     General: PERRL. Normal EOMs        Right eye: No discharge.        Left eye: No discharge.      Conjunctiva/sclera: Conjunctivae normal.  Cardiovascular:     Rate and Rhythm: Normal rate and regular rhythm.     Pulses: Normal pulses.  Pulmonary:     Effort: Pulmonary effort is normal. No respiratory distress.     Breath sounds: Normal breath sounds.  Abdominal:     Abdomen is soft. There is no abdominal tenderness. No rebound or guarding. No distention. Musculoskeletal:        General: No swelling. Normal range of motion.     Cervical back: Normal range of motion and neck supple.  Skin:    General: Skin is warm and dry.     Capillary Refill: Capillary refill takes less than 2 seconds.     Findings: 4 cm x 4 cm area of erythema over the tibial plateau with central opening.  No fluctuance.  No lymphangitis.  No crepitus.  Patient also has mild swelling diffusely to  his lower leg without pitting edema.  Normal 2+ pedal pulses.  Full and normal range of motion of ankle, knee, hip. Neurological:     Mental Status: The patient is awake and alert.      ED Results / Procedures / Treatments   Labs (all labs ordered are listed, but only abnormal results are displayed) Labs Reviewed  CBC WITH DIFFERENTIAL/PLATELET - Abnormal; Notable for the following components:      Result Value   RBC 3.90 (*)    Hemoglobin 11.8 (*)    HCT 33.9 (*)    All other components within normal limits  COMPREHENSIVE METABOLIC PANEL - Abnormal; Notable for the following components:   Glucose, Bld 113 (*)    Calcium 8.7 (*)    All other components within normal limits     EKG     RADIOLOGY I independently reviewed and interpreted imaging and agree with radiologists findings.     PROCEDURES:  Critical Care performed:   Procedures   MEDICATIONS ORDERED IN ED: Medications - No data to display   IMPRESSION / MDM / ASSESSMENT AND PLAN / ED COURSE  I reviewed the triage vital signs and the nursing notes.   Differential diagnosis includes, but is not limited to, cellulitis, abscess, DVT,  dependent edema.  Patient is awake and alert, hemodynamically stable and afebrile.  Physical exam is consistent with developing cellulitis.  Labs are overall reassuring, no leukocytosis.  Ultrasound obtained given his history of DVT and is right lower extremity swelling. This was negative for DVT. No crepitus or bullae or hemodynamic instability or pain out of proportion to indicate deep space infection. No fluctuance to suggest cystic lesion such as abscess.  The wound is open, no active drainage.  No fevers or constitutional symptoms to suggest systemic infection.  Physical exam does not show any evidence of joint involvement. No lymphangitis. Patient will initiate oral antibiotics and follow-up closely as an outpatient. Return if worsening or developing new symptoms in which case may require IV antibiotics.   Discussed care plan, return precautions, and advised close outpatient follow-up. Patient agrees with plan of care.  Patient's presentation is most consistent with acute complicated illness / injury requiring diagnostic workup.    FINAL CLINICAL IMPRESSION(S) / ED DIAGNOSES   Final diagnoses:  Cellulitis of right lower extremity     Rx / DC Orders   ED Discharge Orders          Ordered    cephALEXin (KEFLEX) 500 MG capsule  4 times daily        04/13/22 1701    doxycycline (ADOXA) 100 MG tablet  2 times daily        04/13/22 1701             Note:  This document was prepared using Dragon voice recognition software and may include unintentional dictation errors.   Keturah Shavers 04/13/22 2048    Pilar Jarvis, MD 04/15/22 [phone]

## 2022-04-13 NOTE — ED Triage Notes (Signed)
Patient presents with right leg abscess and bright red, redness around area. Lower leg is also swollen. Reports history of the same  History MRSA

## 2022-04-13 NOTE — Discharge Instructions (Signed)
Please take the antibiotics as prescribed.  Your blood work is normal.  Your ultrasound does not reveal a blood clot.  Please return for any new, worsening, or change in symptoms or other concerns.  It was a pleasure caring for you today.

## 2022-04-13 NOTE — ED Notes (Signed)
US at the bedside

## 2022-04-16 ENCOUNTER — Emergency Department
Admission: EM | Admit: 2022-04-16 | Discharge: 2022-04-16 | Disposition: A | Discharge status: Home / Self Care | Payer: Medicaid Other | Attending: Emergency Medicine | Admitting: Emergency Medicine

## 2022-04-16 ENCOUNTER — Other Ambulatory Visit: Payer: Self-pay

## 2022-04-16 DIAGNOSIS — M7989 Other specified soft tissue disorders: Secondary | ICD-10-CM | POA: Diagnosis present

## 2022-04-16 DIAGNOSIS — L02415 Cutaneous abscess of right lower limb: Secondary | ICD-10-CM | POA: Diagnosis not present

## 2022-04-16 DIAGNOSIS — J45909 Unspecified asthma, uncomplicated: Secondary | ICD-10-CM | POA: Insufficient documentation

## 2022-04-16 DIAGNOSIS — L0291 Cutaneous abscess, unspecified: Secondary | ICD-10-CM

## 2022-04-16 DIAGNOSIS — R6883 Chills (without fever): Secondary | ICD-10-CM | POA: Diagnosis not present

## 2022-04-16 LAB — COMPREHENSIVE METABOLIC PANEL
ALT: 30 U/L (ref 0–44)
AST: 26 U/L (ref 15–41)
Albumin: 4 g/dL (ref 3.5–5.0)
Alkaline Phosphatase: 101 U/L (ref 38–126)
Anion gap: 9 (ref 5–15)
BUN: 10 mg/dL (ref 6–20)
CO2: 29 mmol/L (ref 22–32)
Calcium: 8.9 mg/dL (ref 8.9–10.3)
Chloride: 103 mmol/L (ref 98–111)
Creatinine, Ser: 0.89 mg/dL (ref 0.61–1.24)
GFR, Estimated: >60 mL/min (ref >60)
Glucose, Bld: 131 mg/dL — ABNORMAL HIGH (ref 70–99)
Potassium: 3.1 mmol/L — ABNORMAL LOW (ref 3.5–5.1)
Sodium: 141 mmol/L (ref 135–145)
Total Bilirubin: 0.7 mg/dL (ref 0.3–1.2)
Total Protein: 7.7 g/dL (ref 6.5–8.1)

## 2022-04-16 LAB — CBC WITH DIFFERENTIAL/PLATELET
Abs Immature Granulocytes: 0.03 10*3/uL (ref 0.00–0.07)
Basophils Absolute: 0 10*3/uL (ref 0.0–0.1)
Basophils Relative: 0 %
Eosinophils Absolute: 0.3 10*3/uL (ref 0.0–0.5)
Eosinophils Relative: 4 %
HCT: 33.3 % — ABNORMAL LOW (ref 39.0–52.0)
Hemoglobin: 11.7 g/dL — ABNORMAL LOW (ref 13.0–17.0)
Immature Granulocytes: 0 %
Lymphocytes Relative: 15 %
Lymphs Abs: 1.4 10*3/uL (ref 0.7–4.0)
MCH: 30.7 pg (ref 26.0–34.0)
MCHC: 35.1 g/dL (ref 30.0–36.0)
MCV: 87.4 fL (ref 80.0–100.0)
Monocytes Absolute: 0.9 10*3/uL (ref 0.1–1.0)
Monocytes Relative: 9 %
Neutro Abs: 6.6 10*3/uL (ref 1.7–7.7)
Neutrophils Relative %: 72 %
Platelets: 262 10*3/uL (ref 150–400)
RBC: 3.81 MIL/uL — ABNORMAL LOW (ref 4.22–5.81)
RDW: 12.4 % (ref 11.5–15.5)
WBC: 9.2 10*3/uL (ref 4.0–10.5)
nRBC: 0 % (ref 0.0–0.2)

## 2022-04-16 LAB — LACTIC ACID, PLASMA: Lactic Acid, Venous: 1.4 mmol/L (ref 0.5–1.9)

## 2022-04-16 MED ORDER — LIDOCAINE-EPINEPHRINE 2 %-1:100000 IJ SOLN
20.0000 mL | Freq: Once | INTRAMUSCULAR | Status: AC
  Administered 2022-04-16: 20 mL

## 2022-04-16 MED ORDER — POTASSIUM CHLORIDE CRYS ER 20 MEQ PO TBCR
40.0000 meq | EXTENDED_RELEASE_TABLET | Freq: Once | ORAL | Status: AC
  Administered 2022-04-16: 40 meq via ORAL
  Filled 2022-04-16: qty 2

## 2022-04-16 MED ORDER — KETOROLAC TROMETHAMINE 30 MG/ML IJ SOLN
15.0000 mg | Freq: Once | INTRAMUSCULAR | Status: AC
  Administered 2022-04-16: 15 mg via INTRAVENOUS
  Filled 2022-04-16: qty 1

## 2022-04-16 NOTE — ED Triage Notes (Addendum)
Pt comes with c/o right leg pain and possible infection. Pt was seen here few days ago for same. Pt states he got meds prescribed and has been taking them but feels it is worse.

## 2022-04-16 NOTE — Discharge Instructions (Addendum)
Please finish the doxycycline.  Use a warm compress or soak your leg in warm bath 3-4 times per day.  Please try to elevate the leg when you are sitting down to reduce swelling.  If in 24 to 48 hours redness or pain is increasing despite the antibiotic please return the emergency department.

## 2022-04-16 NOTE — ED Provider Notes (Signed)
[facility] Provider Note    Event Date/Time   First MD Initiated Contact with Patient 04/16/22 0913     (approximate)   History   Leg Pain   HPI  Brian Lutz is a 47 y.o. male past medical history of abscesses and prior MRSA infection who presents with right leg swelling and pain.  Several days ago patient noticed what looked like a pimple around the right knee.  Was seen in the ED and prescribed Keflex and doxycycline which has been taking.  Despite antibiotics feels like the area of redness and swelling is worsened.  Has had some chills but no fevers otherwise is doing well.     Past Medical History:  Diagnosis Date   Asthma    MRSA (methicillin resistant staph aureus) culture positive     Patient Active Problem List   Diagnosis Date Noted   Chronic vomiting 03/08/2021   Abdominal pain, epigastric    Abscess of groin, left 02/05/2015   Nausea 02/05/2015   Hx MRSA infection 02/05/2015   Cellulitis 02/05/2015   Left leg DVT 07/27/2014   Acute embolism and thrombosis of deep vein of left lower extremity 07/27/2014   Cellulitis of left leg 07/24/2014     Physical Exam  Triage Vital Signs: ED Triage Vitals  Enc Vitals Group     BP 04/16/22 0903 115/81     Pulse Rate 04/16/22 0903 82     Resp 04/16/22 0903 16     Temp 04/16/22 0903 98.2 F (36.8 C)     Temp Source 04/16/22 0903 Oral     SpO2 04/16/22 0903 100 %     Weight --      Height --      Head Circumference --      Peak Flow --      Pain Score 04/16/22 0855 6     Pain Loc --      Pain Edu? --      Excl. in GC? --     Most recent vital signs: Vitals:   04/16/22 0903  BP: 115/81  Pulse: 82  Resp: 16  Temp: 98.2 F (36.8 C)  SpO2: 100%     General: Awake, no distress.  CV:  Good peripheral perfusion.  Resp:  Normal effort.  Abd:  No distention.  Neuro:             Awake, Alert, Oriented x 3  Other:  Spontaneously draining area distal to the right patella with  some purulence with surrounding erythema, there is edema of the right calf down to the foot   ED Results / Procedures / Treatments  Labs (all labs ordered are listed, but only abnormal results are displayed) Labs Reviewed  CBC WITH DIFFERENTIAL/PLATELET - Abnormal; Notable for the following components:      Result Value   RBC 3.81 (*)    Hemoglobin 11.7 (*)    HCT 33.3 (*)    All other components within normal limits  COMPREHENSIVE METABOLIC PANEL  LACTIC ACID, PLASMA  LACTIC ACID, PLASMA     EKG     RADIOLOGY    PROCEDURES:  Critical Care performed: No  ..Incision and Drainage  Date/Time: 04/16/2022 10:10 AM  Performed by: Georga Hacking, MD Authorized by: Georga Hacking, MD   Consent:    Consent obtained:  Verbal   Risks discussed:  Bleeding, incomplete drainage and pain Universal protocol:    Patient identity confirmed:  Verbally  with patient Location:    Type:  Abscess   Location:  Lower extremity   Lower extremity location:  Leg   Leg location:  R lower leg Pre-procedure details:    Skin preparation:  Chlorhexidine Sedation:    Sedation type:  None Anesthesia:    Anesthesia method:  Local infiltration   Local anesthetic:  Lidocaine 2% WITH epi Procedure type:    Complexity:  Simple Procedure details:    Ultrasound guidance: no     Incision types:  Single straight   Incision depth:  Dermal   Wound management:  Probed and deloculated   Drainage:  Purulent   Drainage amount:  Moderate   Packing materials:  None Post-procedure details:    Procedure completion:  Tolerated      MEDICATIONS ORDERED IN ED: Medications  lidocaine-EPINEPHrine (XYLOCAINE W/EPI) 2 %-1:100000 (with pres) injection 20 mL (20 mLs Infiltration Given by Other 04/16/22 0949)  ketorolac (TORADOL) 30 MG/ML injection 15 mg (15 mg Intravenous Given 04/16/22 0944)     IMPRESSION / MDM / ASSESSMENT AND PLAN / ED COURSE  I reviewed the triage vital signs and the  nursing notes.                              Patient's presentation is most consistent with acute complicated illness / injury requiring diagnostic workup.  Differential diagnosis includes, but is not limited to, abscess, cellulitis, folliculitis, low suspicion for necrotizing infection  Patient is a 47 year old male who presents with pain and swelling in the right lower extremity.  Noticed a pimple several days ago came to ED was prescribed Keflex Doxy.  Spite taking the antibiotics symptoms have progressed.  On exam he does have an area of fluctuance that spontaneously draining distal to the kneecap.  There is some surrounding erythema and the leg is also diffusely swollen although he does have pitting edema in other extremities well.  Neurovascular intact.  I did perform an I&D of the fluctuant area with return of copious purulence.  Explored for loculations.  Did also check screening labs which are overall reassuring.  Given patient has now achieved source control with I&D I do think he can continue the p.o. antibiotics and be discharged.       FINAL CLINICAL IMPRESSION(S) / ED DIAGNOSES   Final diagnoses:  Abscess     Rx / DC Orders   ED Discharge Orders     None        Note:  This document was prepared using Dragon voice recognition software and may include unintentional dictation errors.   Georga Hacking, MD 04/16/22 1010

## 2022-04-16 NOTE — ED Notes (Signed)
Abserved MD at bedside lance and drain wound. Small amount of sanguineous and serosanguineous fluid and puss drained. Pt tolerated it well.

## 2022-04-24 ENCOUNTER — Emergency Department
Admission: EM | Admit: 2022-04-24 | Discharge: 2022-04-24 | Discharge status: Against Medical Advice | Payer: Medicaid Other | Attending: Emergency Medicine | Admitting: Emergency Medicine

## 2022-04-24 ENCOUNTER — Other Ambulatory Visit: Payer: Self-pay

## 2022-04-24 ENCOUNTER — Encounter: Payer: Self-pay | Admitting: Emergency Medicine

## 2022-04-24 DIAGNOSIS — Z5321 Procedure and treatment not carried out due to patient leaving prior to being seen by health care provider: Secondary | ICD-10-CM | POA: Insufficient documentation

## 2022-04-24 DIAGNOSIS — R21 Rash and other nonspecific skin eruption: Secondary | ICD-10-CM | POA: Diagnosis not present

## 2022-04-24 DIAGNOSIS — R2241 Localized swelling, mass and lump, right lower limb: Secondary | ICD-10-CM | POA: Diagnosis not present

## 2022-04-24 LAB — CBC WITH DIFFERENTIAL/PLATELET
Abs Immature Granulocytes: 0.12 10*3/uL — ABNORMAL HIGH (ref 0.00–0.07)
Basophils Absolute: 0 10*3/uL (ref 0.0–0.1)
Basophils Relative: 1 %
Eosinophils Absolute: 0.3 10*3/uL (ref 0.0–0.5)
Eosinophils Relative: 5 %
HCT: 37.7 % — ABNORMAL LOW (ref 39.0–52.0)
Hemoglobin: 12.7 g/dL — ABNORMAL LOW (ref 13.0–17.0)
Immature Granulocytes: 2 %
Lymphocytes Relative: 31 %
Lymphs Abs: 1.9 10*3/uL (ref 0.7–4.0)
MCH: 29.5 pg (ref 26.0–34.0)
MCHC: 33.7 g/dL (ref 30.0–36.0)
MCV: 87.5 fL (ref 80.0–100.0)
Monocytes Absolute: 0.3 10*3/uL (ref 0.1–1.0)
Monocytes Relative: 6 %
Neutro Abs: 3.4 10*3/uL (ref 1.7–7.7)
Neutrophils Relative %: 55 %
Platelets: 373 10*3/uL (ref 150–400)
RBC: 4.31 MIL/uL (ref 4.22–5.81)
RDW: 12.2 % (ref 11.5–15.5)
WBC: 6 10*3/uL (ref 4.0–10.5)
nRBC: 0 % (ref 0.0–0.2)

## 2022-04-24 LAB — COMPREHENSIVE METABOLIC PANEL
ALT: 19 U/L (ref 0–44)
AST: 21 U/L (ref 15–41)
Albumin: 4.2 g/dL (ref 3.5–5.0)
Alkaline Phosphatase: 87 U/L (ref 38–126)
Anion gap: 7 (ref 5–15)
BUN: 10 mg/dL (ref 6–20)
CO2: 29 mmol/L (ref 22–32)
Calcium: 9 mg/dL (ref 8.9–10.3)
Chloride: 102 mmol/L (ref 98–111)
Creatinine, Ser: 0.83 mg/dL (ref 0.61–1.24)
GFR, Estimated: >60 mL/min (ref >60)
Glucose, Bld: 113 mg/dL — ABNORMAL HIGH (ref 70–99)
Potassium: 3.4 mmol/L — ABNORMAL LOW (ref 3.5–5.1)
Sodium: 138 mmol/L (ref 135–145)
Total Bilirubin: 0.6 mg/dL (ref 0.3–1.2)
Total Protein: 7.9 g/dL (ref 6.5–8.1)

## 2022-04-24 LAB — PROTIME-INR
INR: 1 (ref 0.8–1.2)
Prothrombin Time: 13.3 seconds (ref 11.4–15.2)

## 2022-04-24 LAB — LACTIC ACID, PLASMA: Lactic Acid, Venous: 1 mmol/L (ref 0.5–1.9)

## 2022-04-24 NOTE — ED Triage Notes (Signed)
Patient to ED for right leg swelling. Patient states he had MRSA on leg and was given antibiotic but has not gotten better- just worse. Completed full course of antibiotic. Also, c/o rash on left hand.

## 2022-04-24 NOTE — ED Notes (Signed)
No answer when called several times from lobby 

## 2022-04-25 LAB — CULTURE, BLOOD (ROUTINE X 2): Special Requests: ADEQUATE

## 2022-04-27 LAB — CULTURE, BLOOD (ROUTINE X 2)

## 2022-04-29 LAB — CULTURE, BLOOD (ROUTINE X 2): Culture: NO GROWTH

## 2022-05-09 ENCOUNTER — Other Ambulatory Visit: Payer: Self-pay

## 2022-05-09 ENCOUNTER — Encounter (HOSPITAL_BASED_OUTPATIENT_CLINIC_OR_DEPARTMENT_OTHER): Payer: Self-pay | Admitting: Emergency Medicine

## 2022-05-09 ENCOUNTER — Emergency Department (HOSPITAL_BASED_OUTPATIENT_CLINIC_OR_DEPARTMENT_OTHER): Payer: Medicaid Other

## 2022-05-09 ENCOUNTER — Emergency Department (HOSPITAL_BASED_OUTPATIENT_CLINIC_OR_DEPARTMENT_OTHER)
Admission: EM | Admit: 2022-05-09 | Discharge: 2022-05-09 | Disposition: A | Discharge status: Home / Self Care | Payer: Medicaid Other | Attending: Emergency Medicine | Admitting: Emergency Medicine

## 2022-05-09 DIAGNOSIS — R600 Localized edema: Secondary | ICD-10-CM | POA: Insufficient documentation

## 2022-05-09 DIAGNOSIS — J45909 Unspecified asthma, uncomplicated: Secondary | ICD-10-CM | POA: Diagnosis not present

## 2022-05-09 DIAGNOSIS — M7989 Other specified soft tissue disorders: Secondary | ICD-10-CM | POA: Diagnosis not present

## 2022-05-09 DIAGNOSIS — R2240 Localized swelling, mass and lump, unspecified lower limb: Secondary | ICD-10-CM | POA: Diagnosis not present

## 2022-05-09 LAB — BASIC METABOLIC PANEL
Anion gap: 9 (ref 5–15)
BUN: 9 mg/dL (ref 6–20)
CO2: 28 mmol/L (ref 22–32)
Calcium: 9.1 mg/dL (ref 8.9–10.3)
Chloride: 102 mmol/L (ref 98–111)
Creatinine, Ser: 0.93 mg/dL (ref 0.61–1.24)
GFR, Estimated: >60 mL/min (ref >60)
Glucose, Bld: 94 mg/dL (ref 70–99)
Potassium: 4 mmol/L (ref 3.5–5.1)
Sodium: 139 mmol/L (ref 135–145)

## 2022-05-09 LAB — CBC
HCT: 38.2 % — ABNORMAL LOW (ref 39.0–52.0)
Hemoglobin: 13.1 g/dL (ref 13.0–17.0)
MCH: 29.9 pg (ref 26.0–34.0)
MCHC: 34.3 g/dL (ref 30.0–36.0)
MCV: 87.2 fL (ref 80.0–100.0)
Platelets: 257 10*3/uL (ref 150–400)
RBC: 4.38 MIL/uL (ref 4.22–5.81)
RDW: 13.2 % (ref 11.5–15.5)
WBC: 5.1 10*3/uL (ref 4.0–10.5)
nRBC: 0 % (ref 0.0–0.2)

## 2022-05-09 LAB — BRAIN NATRIURETIC PEPTIDE: B Natriuretic Peptide: 12 pg/mL (ref 0.0–100.0)

## 2022-05-09 MED ORDER — FUROSEMIDE 20 MG PO TABS
20.0000 mg | ORAL_TABLET | Freq: Once | ORAL | Status: AC
  Administered 2022-05-09: 20 mg via ORAL
  Filled 2022-05-09: qty 1

## 2022-05-09 MED ORDER — POTASSIUM CHLORIDE CRYS ER 20 MEQ PO TBCR: 20.0000 meq | EXTENDED_RELEASE_TABLET | Freq: Two times a day (BID) | ORAL | 0 refills | Status: AC

## 2022-05-09 MED ORDER — FUROSEMIDE 20 MG PO TABS: 20.0000 mg | ORAL_TABLET | Freq: Every day | ORAL | 0 refills | Status: AC

## 2022-05-09 NOTE — ED Triage Notes (Signed)
Pt arrives to ED with c/o persistent bilateral leg swelling for >3 weeks. Hx MRSA, he notes he completed his antibiotics from last visit with no resolution.

## 2022-05-09 NOTE — ED Notes (Signed)
Pt verbalized understanding of d/c instructions, meds, and followup care. Explained plan of lasix and importance of taking potassium supplement due to this medication. Denies questions. VSS, no distress noted. Steady gait to exit with all belongings.

## 2022-05-09 NOTE — Discharge Instructions (Signed)
Return to the ED with any new symptom such as fevers, chest pain or shortness of breath As discussed, you had no evidence of heart failure based on your lab work.  I prescribed you Lasix which is a medication to help you urinate more often which will help you to get this fluid off of your legs.  Please take this once a day for the next 3 days.  Please also take potassium twice a day for the next 3 days. Please follow-up with PCP that I have referred you to for further management.  Please call [facility] to make an appointment to be seen

## 2022-05-09 NOTE — ED Provider Notes (Signed)
[place] [facility] AT [facility] Provider Note   CSN: [id number] Arrival date & time: 05/09/22  1700     History  Chief Complaint  Patient presents with   Leg Swelling    Brian Lutz is a 47 y.o. male with medical history of asthma, recent MRSA infection, DVT.  The patient presents to ED for evaluation of bilateral lower extremity swelling.  The patient reports that on 4/12 he was diagnosed with cellulitis of his right lower extremity.  The patient was placed on doxycycline and Keflex at this original visit.  The patient states that he then returned to the ED on 4/15 as his cellulitis of the right lower extremity developed into an abscess.  The patient underwent I&D at this time and he was advised to continue taking antibiotics.  The patient states that he has taken antibiotics to completion at this time however he has developed worsening lower extremity edema.  The patient states that 2 days ago he developed left foot edema, he states that his right lower extremity has been edematous ever since developing cellulitis.  He denies any chest pain or shortness of breath, nausea, vomiting, fevers, body aches or chills.  HPI     Home Medications Prior to Admission medications   Medication Sig Start Date End Date Taking? Authorizing Provider  furosemide (LASIX) 20 MG tablet Take 1 tablet (20 mg total) by mouth daily. 05/09/22  Yes Al Decant, PA-C  potassium chloride SA (KLOR-CON M) 20 MEQ tablet Take 1 tablet (20 mEq total) by mouth 2 (two) times daily. 05/09/22  Yes Al Decant, PA-C  amitriptyline (ELAVIL) 10 MG tablet Take 1 tablet (10 mg total) by mouth at bedtime. 03/28/16 06/28/16  Wyline Mood, MD  amoxicillin (AMOXIL) 500 MG capsule Take 1 capsule (500 mg total) by mouth 3 (three) times daily. 02/18/22   Darrick Grinder, PA-C  azithromycin (ZITHROMAX) 250 MG tablet Take 1 tablet (250 mg total) by mouth daily. Take first 2 tablets together, then 1  every day until finished. 02/18/22   Darrick Grinder, PA-C  famotidine (PEPCID) 20 MG tablet Take by mouth. Patient not taking: Reported on 06/12/2021 07/21/15   [provider]  Pancrelipase, Lip-Prot-Amyl, (PANCREAZE) 2600-8800 units CPEP TAKE 2 CAPSULES BY MOUTH 3 TIMES A DAY WITH MEALS ALSO TAKE 1 CAP AS NEEDED WITH SNACK 12/22/21   Wyline Mood, MD      Allergies    Patient has no known allergies.    Review of Systems   Review of Systems  Respiratory:  Negative for shortness of breath.   Cardiovascular:  Positive for leg swelling. Negative for chest pain.  All other systems reviewed and are negative.   Physical Exam Updated Vital Signs BP (!) 106/92   Pulse 72   Temp (!) 97.5 F (36.4 C) (Oral)   Resp 16   Ht 6\' 1"  (1.854 m)   Wt 88.5 kg   SpO2 96%   BMI 25.73 kg/m  Physical Exam Vitals and nursing note reviewed.  Constitutional:      General: He is not in acute distress.    Appearance: Normal appearance. He is not ill-appearing, toxic-appearing or diaphoretic.  HENT:     Head: Normocephalic and atraumatic.     Nose: Nose normal.     Mouth/Throat:     Mouth: Mucous membranes are moist.     Pharynx: Oropharynx is clear.  Eyes:     Extraocular Movements: Extraocular movements intact.  Conjunctiva/sclera: Conjunctivae normal.     Pupils: Pupils are equal, round, and reactive to light.  Cardiovascular:     Rate and Rhythm: Normal rate and regular rhythm.     Pulses:          Dorsalis pedis pulses are 2+ on the right side and 2+ on the left side.  Pulmonary:     Effort: Pulmonary effort is normal.     Breath sounds: Normal breath sounds. No wheezing.  Abdominal:     General: Abdomen is flat. Bowel sounds are normal.     Palpations: Abdomen is soft.     Tenderness: There is no abdominal tenderness.  Musculoskeletal:     Cervical back: Normal range of motion and neck supple. No tenderness.     Right lower leg: 2+ Pitting Edema present.     Left lower  leg: 2+ Pitting Edema present.  Skin:    General: Skin is warm and dry.     Capillary Refill: Capillary refill takes less than 2 seconds.  Neurological:     Mental Status: He is alert and oriented to person, place, and time.       ED Results / Procedures / Treatments   Labs (all labs ordered are listed, but only abnormal results are displayed) Labs Reviewed  CBC - Abnormal; Notable for the following components:      Result Value   HCT 38.2 (*)    All other components within normal limits  BASIC METABOLIC PANEL  BRAIN NATRIURETIC PEPTIDE    EKG None  Radiology US Venous Img Lower Right (DVT Study)  Result Date: 05/09/2022 CLINICAL DATA:  Right lower extremity pain for 3 weeks EXAM: Right LOWER EXTREMITY VENOUS DOPPLER ULTRASOUND TECHNIQUE: Gray-scale sonography with compression, as well as color and duplex ultrasound, were performed to evaluate the deep venous system(s) from the level of the common femoral vein through the popliteal and proximal calf veins. COMPARISON:  Ultrasound 04/13/2022 FINDINGS: VENOUS Normal compressibility of the common femoral, superficial femoral, and popliteal veins, as well as the visualized calf veins. Visualized portions of profunda femoral vein and great saphenous vein unremarkable. No filling defects to suggest DVT on grayscale or color Doppler imaging. Doppler waveforms show normal direction of venous flow, normal respiratory plasticity and response to augmentation. Limited views of the contralateral common femoral vein are unremarkable. OTHER A right inguinal lymph node measures 10 mm in short axis and is favored reactive. Subcutaneous edema in the calf. Limitations: none IMPRESSION: 1. Negative for acute DVT. 2. Subcutaneous edema in the calf. Electronically Signed   By: Minerva Fester M.D.   On: 05/09/2022 19:50    Procedures Procedures   Medications Ordered in ED Medications  furosemide (LASIX) tablet 20 mg (20 mg Oral Given 05/09/22 1829)     ED Course/ Medical Decision Making/ A&P Clinical Course as of 05/09/22 2227  Wed May 09, 2022  2140 Lasix and K [CG]    Clinical Course User Index [CG] Al Decant, PA-C    Medical Decision Making Amount and/or Complexity of Data Reviewed Labs: ordered.  Risk Prescription drug management.   47 year old no presents to the ED for evaluation.  Please see HPI for further details.  On examination patient afebrile and nontachycardic.  Lung sounds are clear bilaterally, he is not hypoxic.  His abdomen is soft and compressible throughout.  He has 2+ bilateral pitting edema to his lower extremities.  There is no evidence of erythema or cellulitis to his lower extremities  bilaterally.  Please see picture attached.  CBC without leukocytosis or anemia.  BMP without electrolyte derangement.  BNP not elevated.  Ultrasound of right lower extremity does not show evidence of DVT.  There is noted subcutaneous edema in the calf.  At this time doubt cellulitis.  Patient given 20 mg of Lasix and diuresed well here.  The patient will be sent home with 3 days of Lasix and potassium.  He will follow-up with his PCP for further management.  Return precautions were discussed and he voiced understanding.  All questions answered to his satisfaction.  Stable to discharge.   Final Clinical Impression(s) / ED Diagnoses Final diagnoses:  Leg swelling    Rx / DC Orders ED Discharge Orders          Ordered    furosemide (LASIX) 20 MG tablet  Daily        05/09/22 2226    potassium chloride SA (KLOR-CON M) 20 MEQ tablet  2 times daily        05/09/22 2226              Clent Ridges 05/09/22 2227    Alvira Monday, MD 05/10/22 1627

## 2022-11-14 DIAGNOSIS — H5213 Myopia, bilateral: Secondary | ICD-10-CM | POA: Diagnosis not present

## 2022-12-02 IMAGING — CR DG CHEST 2V
2 series · 2 of 2 positions shown · non-contrast
Comparison: None.

CLINICAL DATA: Productive cough and shortness of breath, possible
[id number] infection.

EXAM:
CHEST - 2 VIEW

[chest pa]
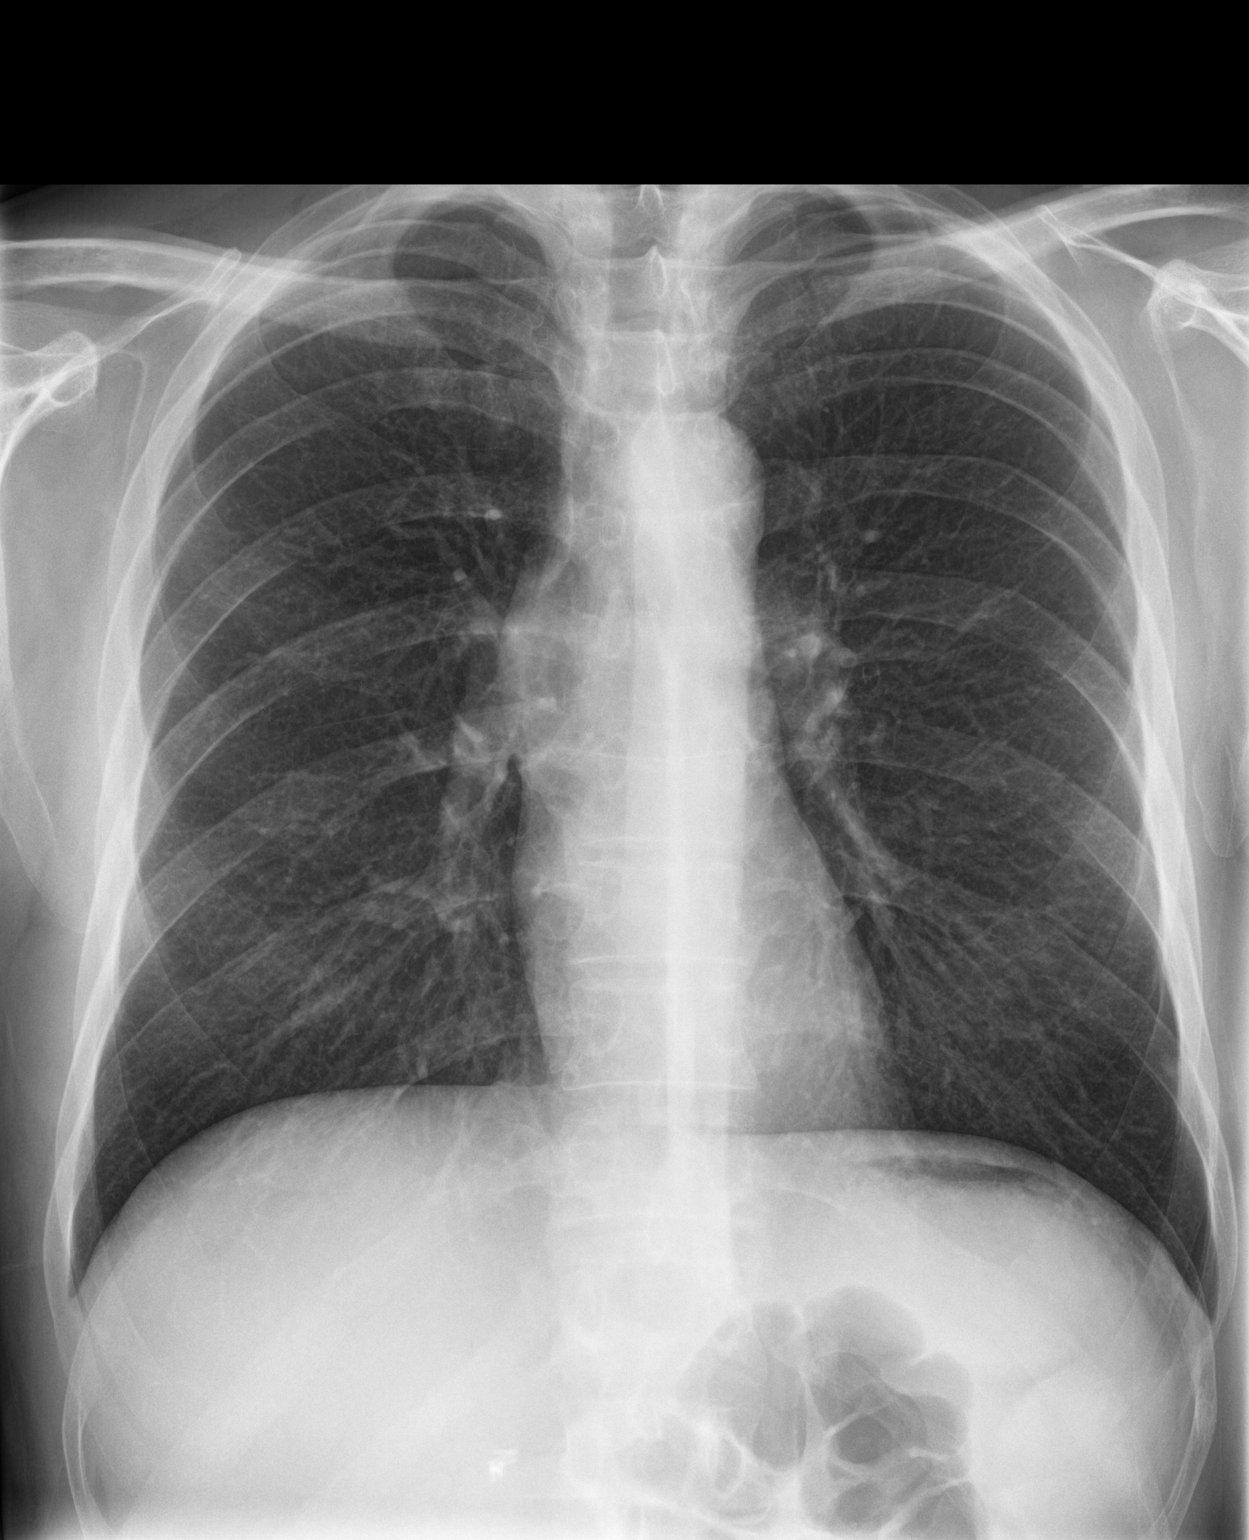

[chest lat]
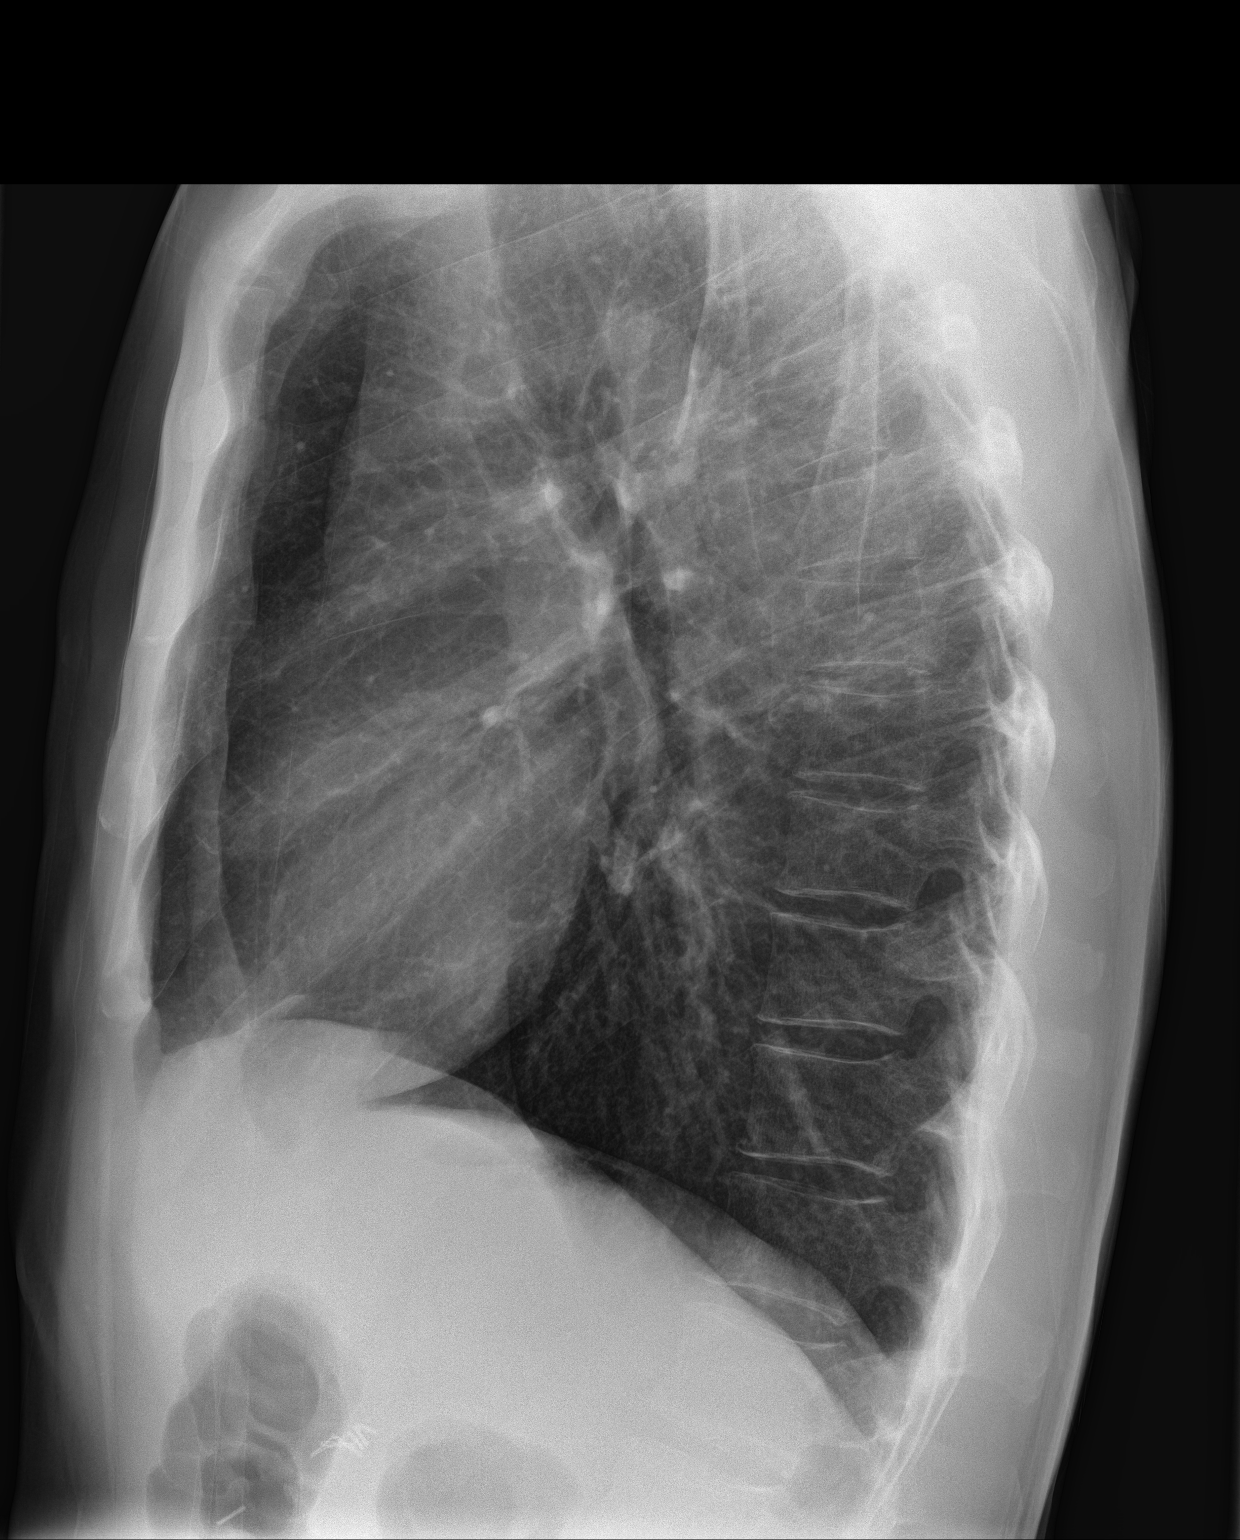

[2 of 2 positions shown; findings below may reference images not displayed]

FINDINGS: Lungs are adequately inflated without focal airspace consolidation
or effusion. Cardiomediastinal silhouette is normal. Bones and soft
tissues are normal.
IMPRESSION: No active cardiopulmonary disease.

## 2023-02-13 DIAGNOSIS — H524 Presbyopia: Secondary | ICD-10-CM | POA: Diagnosis not present

## 2023-03-07 ENCOUNTER — Other Ambulatory Visit: Payer: Self-pay

## 2023-03-07 ENCOUNTER — Emergency Department
Admission: EM | Admit: 2023-03-07 | Discharge: 2023-03-07 | Discharge status: Against Medical Advice | Attending: Emergency Medicine | Admitting: Emergency Medicine

## 2023-03-07 ENCOUNTER — Encounter: Payer: Self-pay | Admitting: Emergency Medicine

## 2023-03-07 DIAGNOSIS — Z5321 Procedure and treatment not carried out due to patient leaving prior to being seen by health care provider: Secondary | ICD-10-CM | POA: Diagnosis not present

## 2023-03-07 DIAGNOSIS — M7989 Other specified soft tissue disorders: Secondary | ICD-10-CM | POA: Diagnosis not present

## 2023-03-07 LAB — BASIC METABOLIC PANEL
Anion gap: 6 (ref 5–15)
BUN: 11 mg/dL (ref 6–20)
CO2: 28 mmol/L (ref 22–32)
Calcium: 8.6 mg/dL — ABNORMAL LOW (ref 8.9–10.3)
Chloride: 106 mmol/L (ref 98–111)
Creatinine, Ser: 0.94 mg/dL (ref 0.61–1.24)
GFR, Estimated: >60 mL/min (ref >60)
Glucose, Bld: 109 mg/dL — ABNORMAL HIGH (ref 70–99)
Potassium: 4.1 mmol/L (ref 3.5–5.1)
Sodium: 140 mmol/L (ref 135–145)

## 2023-03-07 LAB — CBC
HCT: 36.6 % — ABNORMAL LOW (ref 39.0–52.0)
Hemoglobin: 13 g/dL (ref 13.0–17.0)
MCH: 30.2 pg (ref 26.0–34.0)
MCHC: 35.5 g/dL (ref 30.0–36.0)
MCV: 85.1 fL (ref 80.0–100.0)
Platelets: 234 10*3/uL (ref 150–400)
RBC: 4.3 MIL/uL (ref 4.22–5.81)
RDW: 12.3 % (ref 11.5–15.5)
WBC: 7 10*3/uL (ref 4.0–10.5)
nRBC: 0 % (ref 0.0–0.2)

## 2023-03-07 LAB — BRAIN NATRIURETIC PEPTIDE: B Natriuretic Peptide: 6.1 pg/mL (ref 0.0–100.0)

## 2023-03-07 NOTE — ED Notes (Signed)
Patient called x3 with no answer. 

## 2023-03-07 NOTE — ED Triage Notes (Signed)
 Pt via POV from home. 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 Pt has been seen multiple times for same. States he was ruled out for DVT. States that leg is tender touch. Pt is A&Ox4 and NAD, ambulatory to triage

## 2023-03-25 DIAGNOSIS — L03116 Cellulitis of left lower limb: Secondary | ICD-10-CM | POA: Diagnosis not present

## 2023-03-25 DIAGNOSIS — R600 Localized edema: Secondary | ICD-10-CM | POA: Diagnosis not present

## 2023-04-09 ENCOUNTER — Telehealth: Payer: Self-pay | Admitting: Family

## 2023-04-09 NOTE — Telephone Encounter (Signed)
 FYI  E2C2 placed a NP in a hospital f/u slot at 20 minutes tomorrow 4/8  I am going to keep him on there, I have blocked my 1040 but if you could report to the E2C2 group that'd be great. Thanks! (I'm at my one NP a day session limit, and they didn't use a 40 min slot just for summary)

## 2023-04-10 ENCOUNTER — Inpatient Hospital Stay: Admitting: Family

## 2023-04-12 ENCOUNTER — Telehealth: Payer: Self-pay | Admitting: Family

## 2023-04-12 NOTE — Telephone Encounter (Signed)
 No show new patient, please dismiss. Thanks

## 2023-04-30 ENCOUNTER — Emergency Department
Admission: EM | Admit: 2023-04-30 | Discharge: 2023-04-30 | Discharge status: Against Medical Advice | Attending: Emergency Medicine | Admitting: Emergency Medicine

## 2023-04-30 ENCOUNTER — Other Ambulatory Visit: Payer: Self-pay

## 2023-04-30 DIAGNOSIS — Z5321 Procedure and treatment not carried out due to patient leaving prior to being seen by health care provider: Secondary | ICD-10-CM | POA: Insufficient documentation

## 2023-04-30 DIAGNOSIS — R2242 Localized swelling, mass and lump, left lower limb: Secondary | ICD-10-CM | POA: Insufficient documentation

## 2023-04-30 LAB — CBC WITH DIFFERENTIAL/PLATELET
Abs Immature Granulocytes: 0.02 10*3/uL (ref 0.00–0.07)
Basophils Absolute: 0 10*3/uL (ref 0.0–0.1)
Basophils Relative: 0 %
Eosinophils Absolute: 0.4 10*3/uL (ref 0.0–0.5)
Eosinophils Relative: 5 %
HCT: 37.6 % — ABNORMAL LOW (ref 39.0–52.0)
Hemoglobin: 13.2 g/dL (ref 13.0–17.0)
Immature Granulocytes: 0 %
Lymphocytes Relative: 29 %
Lymphs Abs: 2.2 10*3/uL (ref 0.7–4.0)
MCH: 31.1 pg (ref 26.0–34.0)
MCHC: 35.1 g/dL (ref 30.0–36.0)
MCV: 88.7 fL (ref 80.0–100.0)
Monocytes Absolute: 0.5 10*3/uL (ref 0.1–1.0)
Monocytes Relative: 7 %
Neutro Abs: 4.4 10*3/uL (ref 1.7–7.7)
Neutrophils Relative %: 59 %
Platelets: 256 10*3/uL (ref 150–400)
RBC: 4.24 MIL/uL (ref 4.22–5.81)
RDW: 12.2 % (ref 11.5–15.5)
WBC: 7.6 10*3/uL (ref 4.0–10.5)
nRBC: 0 % (ref 0.0–0.2)

## 2023-04-30 LAB — COMPREHENSIVE METABOLIC PANEL WITH GFR
ALT: 24 U/L (ref 0–44)
AST: 24 U/L (ref 15–41)
Albumin: 3.6 g/dL (ref 3.5–5.0)
Alkaline Phosphatase: 57 U/L (ref 38–126)
Anion gap: 9 (ref 5–15)
BUN: 7 mg/dL (ref 6–20)
CO2: 26 mmol/L (ref 22–32)
Calcium: 8.7 mg/dL — ABNORMAL LOW (ref 8.9–10.3)
Chloride: 105 mmol/L (ref 98–111)
Creatinine, Ser: 1.02 mg/dL (ref 0.61–1.24)
GFR, Estimated: >60 mL/min (ref >60)
Glucose, Bld: 77 mg/dL (ref 70–99)
Potassium: 3.2 mmol/L — ABNORMAL LOW (ref 3.5–5.1)
Sodium: 140 mmol/L (ref 135–145)
Total Bilirubin: 0.7 mg/dL (ref 0.0–1.2)
Total Protein: 6.3 g/dL — ABNORMAL LOW (ref 6.5–8.1)

## 2023-04-30 LAB — LACTIC ACID, PLASMA: Lactic Acid, Venous: 1.4 mmol/L (ref 0.5–1.9)

## 2023-04-30 NOTE — ED Triage Notes (Signed)
 Pt to ED for leg swelling and tightness to L leg since unknown amount of time--chart says pt seen for similar May 2024.   Lower leg is red and swollen. States was "oozing" last night. Had MRSA to R leg prior and pt states the infection moved to L leg. States has gained water weight over last month.   Pt snappy with nurse and ED tech when asking triage questions.

## 2023-04-30 NOTE — ED Notes (Signed)
 Patient called x3 with no answer from lobby. Pt not visualized by staff.
